# Patient Record
Sex: Female | Born: 2000
Health system: Southern US, Community
[De-identification: ages and names within clinical notes are randomized; demographics above are authoritative.]

---
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
## 2020-11-03 ENCOUNTER — Other Ambulatory Visit: Payer: Self-pay

## 2020-11-03 ENCOUNTER — Encounter: Payer: Self-pay | Admitting: Emergency Medicine

## 2020-11-03 ENCOUNTER — Ambulatory Visit
Admission: EM | Admit: 2020-11-03 | Discharge: 2020-11-03 | Disposition: A | Discharge status: Home / Self Care | Payer: Medicaid Other | Attending: Emergency Medicine | Admitting: Emergency Medicine

## 2020-11-03 ENCOUNTER — Ambulatory Visit: Admit: 2020-11-03 | Disposition: A | Discharge status: Home / Self Care | Payer: Self-pay

## 2020-11-03 DIAGNOSIS — R1013 Epigastric pain: Secondary | ICD-10-CM | POA: Diagnosis not present

## 2020-11-03 LAB — POCT URINALYSIS DIP (MANUAL ENTRY)
Blood, UA: NEGATIVE
Glucose, UA: NEGATIVE mg/dL
Ketones, POC UA: NEGATIVE mg/dL
Nitrite, UA: NEGATIVE
Spec Grav, UA: 1.025 (ref 1.010–1.025)
Urobilinogen, UA: >=8 E.U./dL — AB
pH, UA: 7 (ref 5.0–8.0)

## 2020-11-03 LAB — POCT URINE PREGNANCY: Preg Test, Ur: NEGATIVE

## 2020-11-03 MED ORDER — LIDOCAINE VISCOUS HCL 2 % MT SOLN
15.0000 mL | Freq: Once | OROMUCOSAL | Status: AC
  Administered 2020-11-03: 15 mL via ORAL

## 2020-11-03 MED ORDER — OMEPRAZOLE 20 MG PO CPDR: 20.0000 mg | DELAYED_RELEASE_CAPSULE | Freq: Two times a day (BID) | ORAL | 0 refills | Status: DC

## 2020-11-03 MED ORDER — ONDANSETRON 4 MG PO TBDP: 4.0000 mg | ORAL_TABLET | Freq: Three times a day (TID) | ORAL | 0 refills | Status: DC | PRN

## 2020-11-03 MED ORDER — ALUM & MAG HYDROXIDE-SIMETH 200-200-20 MG/5ML PO SUSP
30.0000 mL | Freq: Once | ORAL | Status: AC
  Administered 2020-11-03: 30 mL via ORAL

## 2020-11-03 MED ORDER — FAMOTIDINE 20 MG PO TABS: 20.0000 mg | ORAL_TABLET | Freq: Two times a day (BID) | ORAL | 0 refills | Status: DC

## 2020-11-03 NOTE — ED Provider Notes (Signed)
[facility]    CSN: [id number] Arrival date & time: 11/03/20  1139      History   Chief Complaint Chief Complaint  Patient presents with   Abdominal Pain    HPI Christy Quinn is a 20 y.o. female presenting today for evaluation of abdominal pain.  Reports pain in her upper abdomen described as a sharp sensation.  Associated nausea and vomiting which has since subsided.  Reports pain has eased off some, but overall has been persistent over the past 2 days.  Located in upper abdomen.  Denies any diarrhea or change in bowel movements.  Last bowel movement earlier today and was normal in size consistency, denies straining.  Denies blood in stool.  Denies any history of significant alcohol or tobacco use.  Denies history of GI problems.  Denies radiation into chest.  Denies any sore throat.  HPI  History reviewed. No pertinent past medical history.  There are no problems to display for this patient.   History reviewed. No pertinent surgical history.  OB History   No obstetric history on file.      Home Medications    Prior to Admission medications   Medication Sig Start Date End Date Taking? Authorizing Provider  famotidine (PEPCID) 20 MG tablet Take 1 tablet (20 mg total) by mouth 2 (two) times daily. 11/03/20  Yes Esly Selvage C, PA-C  omeprazole (PRILOSEC) 20 MG capsule Take 1 capsule (20 mg total) by mouth 2 (two) times daily before a meal for 15 days. 11/03/20 11/18/20 Yes Korynn Kenedy C, PA-C  ondansetron (ZOFRAN ODT) 4 MG disintegrating tablet Take 1 tablet (4 mg total) by mouth every 8 (eight) hours as needed for nausea or vomiting. 11/03/20  Yes Armondo Cech, Junius Creamer, PA-C    Family History History reviewed. No pertinent family history.  Social History Social History   Tobacco Use   Smoking status: Never   Smokeless tobacco: Never  Vaping Use   Vaping Use: Every day   Substances: Nicotine  Substance Use Topics   Alcohol use: Yes    Alcohol/week: 2.0  standard drinks    Types: 2 Shots of liquor per week    Comment: "tequilla, but not often"   Drug use: Yes    Frequency: 7.0 times per week    Types: Marijuana     Allergies   Patient has no known allergies.   Review of Systems Review of Systems  Constitutional:  Negative for fatigue and fever.  HENT:  Negative for mouth sores.   Eyes:  Negative for visual disturbance.  Respiratory:  Negative for shortness of breath.   Cardiovascular:  Negative for chest pain.  Gastrointestinal:  Positive for abdominal pain, nausea and vomiting. Negative for blood in stool, constipation and diarrhea.  Genitourinary:  Negative for genital sores.  Musculoskeletal:  Negative for arthralgias and joint swelling.  Skin:  Negative for color change, rash and wound.  Neurological:  Negative for dizziness, weakness, light-headedness and headaches.    Physical Exam Triage Vital Signs ED Triage Vitals  Enc Vitals Group     BP 11/03/20 1235 122/77     Pulse Rate 11/03/20 1235 60     Resp 11/03/20 1235 16     Temp 11/03/20 1235 97.9 F (36.6 C)     Temp Source 11/03/20 1235 Oral     SpO2 11/03/20 1235 98 %     Weight --      Height --      Head Circumference --  Peak Flow --      Pain Score 11/03/20 1232 10     Pain Loc --      Pain Edu? --      Excl. in GC? --    No data found.  Updated Vital Signs BP 122/77 (BP Location: Right Arm)   Pulse 60   Temp 97.9 F (36.6 C) (Oral)   Resp 16   LMP 10/17/2020 (Exact Date)   SpO2 98%   Visual Acuity Right Eye Distance:   Left Eye Distance:   Bilateral Distance:    Right Eye Near:   Left Eye Near:    Bilateral Near:     Physical Exam Vitals and nursing note reviewed.  Constitutional:      Appearance: She is well-developed.     Comments: No acute distress  HENT:     Head: Normocephalic and atraumatic.     Ears:     Comments:      Nose: Nose normal.     Mouth/Throat:     Comments: Oral mucosa pink and moist, no tonsillar  enlargement or exudate. Posterior pharynx patent and nonerythematous, no uvula deviation or swelling. Normal phonation.  Eyes:     Conjunctiva/sclera: Conjunctivae normal.  Cardiovascular:     Rate and Rhythm: Normal rate and regular rhythm.  Pulmonary:     Effort: Pulmonary effort is normal. No respiratory distress.     Comments: Breathing comfortably at rest, CTABL, no wheezing, rales or other adventitious sounds auscultated  Abdominal:     General: There is no distension.     Comments: Soft, nondistended, tenderness to palpation in epigastrium and left upper quadrant, negative rebound, negative Rovsing, negative McBurney's, negative Murphy's  Musculoskeletal:        General: Normal range of motion.     Cervical back: Neck supple.  Skin:    General: Skin is warm and dry.  Neurological:     Mental Status: She is alert and oriented to person, place, and time.     UC Treatments / Results  Labs (all labs ordered are listed, but only abnormal results are displayed) Labs Reviewed  POCT URINALYSIS DIP (MANUAL ENTRY) - Abnormal; Notable for the following components:      Result Value   Bilirubin, UA small (*)    Protein Ur, POC trace (*)    Urobilinogen, UA >=8.0 (*)    Leukocytes, UA Small (1+) (*)    All other components within normal limits  POCT URINE PREGNANCY    EKG   Radiology No results found.  Procedures Procedures (including critical care time)  Medications Ordered in UC Medications  alum & mag hydroxide-simeth (MAALOX/MYLANTA) 200-200-20 MG/5ML suspension 30 mL (30 mLs Oral Given 11/03/20 1303)    And  lidocaine (XYLOCAINE) 2 % viscous mouth solution 15 mL (15 mLs Oral Given 11/03/20 1303)    Initial Impression / Assessment and Plan / UC Course  I have reviewed the triage vital signs and the nursing notes.  Pertinent labs & imaging results that were available during my care of the patient were reviewed by me and considered in my medical decision making (see  chart for details).     Upper abdominal discomfort in epigastrium/left upper quadrant, suspect most likely underlying gastritis or GERD contributing to symptoms.  GI cocktail provided with some improvement of symptoms.  We will proceed with treatment with 2-week trial of PPI along with Pepcid for more symptomatic relief, avoid trigger foods.  Zofran as needed for nausea.  Discussed strict return precautions. Patient verbalized understanding and is agreeable with plan.  Final Clinical Impressions(s) / UC Diagnoses   Final diagnoses:  Epigastric pain     Discharge Instructions      Begin omeprazole twice daily with meals x2 weeks for prevention of underlying acid Supplement with Pepcid twice daily as needed for underlying abdominal discomfort Zofran dissolved in mouth as needed for nausea Avoid trigger foods-see attached Follow-up if not improving or worsening     ED Prescriptions     Medication Sig Dispense Auth. Provider   ondansetron (ZOFRAN ODT) 4 MG disintegrating tablet Take 1 tablet (4 mg total) by mouth every 8 (eight) hours as needed for nausea or vomiting. 20 tablet Shauna Bodkins C, PA-C   famotidine (PEPCID) 20 MG tablet Take 1 tablet (20 mg total) by mouth 2 (two) times daily. 30 tablet Dorrance Sellick C, PA-C   omeprazole (PRILOSEC) 20 MG capsule Take 1 capsule (20 mg total) by mouth 2 (two) times daily before a meal for 15 days. 30 capsule Shakesha Soltau, [place] C, PA-C      PDMP not reviewed this encounter.   Lew Dawes, [place] 11/03/20 1358

## 2020-11-03 NOTE — Discharge Instructions (Addendum)
Begin omeprazole twice daily with meals x2 weeks for prevention of underlying acid Supplement with Pepcid twice daily as needed for underlying abdominal discomfort Zofran dissolved in mouth as needed for nausea Avoid trigger foods-see attached Follow-up if not improving or worsening

## 2020-11-03 NOTE — ED Triage Notes (Signed)
Patient c/o ABD pain x 2 days.   Patient denies fever. Patient denies dysuria, diarrhea or constipation.   Patient states " I've been having this pain all year but it's been constant since Saturday night".   Patient endorses nausea.   Patient hasn't taken any medications for symptoms.

## 2020-12-23 ENCOUNTER — Ambulatory Visit (HOSPITAL_BASED_OUTPATIENT_CLINIC_OR_DEPARTMENT_OTHER): Payer: Medicaid Other | Admitting: Family Medicine

## 2020-12-30 ENCOUNTER — Ambulatory Visit
Admission: EM | Admit: 2020-12-30 | Discharge: 2020-12-30 | Disposition: A | Discharge status: Home / Self Care | Payer: Medicaid Other | Attending: Physician Assistant | Admitting: Physician Assistant

## 2020-12-30 ENCOUNTER — Other Ambulatory Visit: Payer: Self-pay

## 2020-12-30 DIAGNOSIS — R112 Nausea with vomiting, unspecified: Secondary | ICD-10-CM

## 2020-12-30 DIAGNOSIS — R1013 Epigastric pain: Secondary | ICD-10-CM

## 2020-12-30 MED ORDER — ONDANSETRON 4 MG PO TBDP: 4.0000 mg | ORAL_TABLET | Freq: Three times a day (TID) | ORAL | 0 refills | Status: DC | PRN

## 2020-12-30 NOTE — ED Triage Notes (Signed)
Pt c/o abdominal pain, nausea, dizziness, and emesis. States her stomach has been hurting since the beginning of the year and it happens when she eats a lot of food or certain foods. States she has been to UC before for this and was treated with a nausea medication that did provide symptom relief.

## 2020-12-30 NOTE — Discharge Instructions (Addendum)
Use ondansetron as needed for nausea. I recommend you establish care with primary care. We will call with any abnormal testing results-- otherwise check MyChart for all results.

## 2020-12-30 NOTE — ED Provider Notes (Signed)
[facility]    CSN: [id number] Arrival date & time: 12/30/20  1737      History   Chief Complaint Chief Complaint  Patient presents with   Emesis    HPI Christy Quinn is a 20 y.o. female.   Patient here today for nausea, vomiting and epigastric pain that started recently.  She states that she has had intermittent symptoms of the same, and in the past has been given a nausea medicine which has been helpful.  She denies any fever or chills.  She has not had any diarrhea.  She states in the past she has been told she has GERD but she is not sure if this was an accurate diagnosis. She denies any blood in her stool or dark tarry stools.   The history is provided by the patient.  Emesis Associated symptoms: abdominal pain   Associated symptoms: no chills, no diarrhea and no fever    History reviewed. No pertinent past medical history.  There are no problems to display for this patient.   History reviewed. No pertinent surgical history.  OB History   No obstetric history on file.      Home Medications    Prior to Admission medications   Medication Sig Start Date End Date Taking? Authorizing Provider  ondansetron (ZOFRAN-ODT) 4 MG disintegrating tablet Take 1 tablet (4 mg total) by mouth every 8 (eight) hours as needed for nausea or vomiting. 12/30/20  Yes Tomi Bamberger, PA-C  famotidine (PEPCID) 20 MG tablet Take 1 tablet (20 mg total) by mouth 2 (two) times daily. 11/03/20   Wieters, Hallie C, PA-C  omeprazole (PRILOSEC) 20 MG capsule Take 1 capsule (20 mg total) by mouth 2 (two) times daily before a meal for 15 days. 11/03/20 11/18/20  Wieters, Junius Creamer, PA-C    Family History History reviewed. No pertinent family history.  Social History Social History   Tobacco Use   Smoking status: Never   Smokeless tobacco: Never  Vaping Use   Vaping Use: Every day   Substances: Nicotine  Substance Use Topics   Alcohol use: Yes    Alcohol/week: 2.0 standard  drinks    Types: 2 Shots of liquor per week    Comment: "tequilla, but not often"   Drug use: Yes    Frequency: 7.0 times per week    Types: Marijuana     Allergies   Patient has no known allergies.   Review of Systems Review of Systems  Constitutional:  Negative for chills and fever.  Eyes:  Negative for discharge and redness.  Respiratory:  Negative for shortness of breath.   Gastrointestinal:  Positive for abdominal pain, nausea and vomiting. Negative for blood in stool and diarrhea.    Physical Exam Triage Vital Signs ED Triage Vitals  Enc Vitals Group     BP 12/30/20 1802 138/68     Pulse Rate 12/30/20 1802 60     Resp 12/30/20 1802 18     Temp 12/30/20 1802 98.1 F (36.7 C)     Temp Source 12/30/20 1802 Oral     SpO2 12/30/20 1802 100 %     Weight --      Height --      Head Circumference --      Peak Flow --      Pain Score 12/30/20 1803 5     Pain Loc --      Pain Edu? --      Excl. in GC? --  No data found.  Updated Vital Signs BP 138/68 (BP Location: Left Arm)   Pulse 60   Temp 98.1 F (36.7 C) (Oral)   Resp 18   SpO2 100%   Physical Exam Vitals and nursing note reviewed.  Constitutional:      General: She is not in acute distress.    Appearance: Normal appearance. She is not ill-appearing.  HENT:     Head: Normocephalic and atraumatic.  Eyes:     Conjunctiva/sclera: Conjunctivae normal.  Cardiovascular:     Rate and Rhythm: Normal rate and regular rhythm.     Heart sounds: Normal heart sounds. No murmur heard. Pulmonary:     Effort: Pulmonary effort is normal. No respiratory distress.     Breath sounds: Normal breath sounds. No wheezing, rhonchi or rales.  Abdominal:     General: Abdomen is flat. Bowel sounds are normal. There is no distension.     Palpations: Abdomen is soft.     Tenderness: There is no abdominal tenderness. There is no guarding.  Neurological:     Mental Status: She is alert.     UC Treatments / Results   Labs (all labs ordered are listed, but only abnormal results are displayed) Labs Reviewed  CBC WITH DIFFERENTIAL/PLATELET  COMPREHENSIVE METABOLIC PANEL  AMYLASE  LIPASE    EKG   Radiology No results found.  Procedures Procedures (including critical care time)  Medications Ordered in UC Medications - No data to display  Initial Impression / Assessment and Plan / UC Course  I have reviewed the triage vital signs and the nursing notes.  Pertinent labs & imaging results that were available during my care of the patient were reviewed by me and considered in my medical decision making (see chart for details).  Will order labs for further evaluation of epigastric pain.  Discussed concern for possible gallbladder etiology, and recommended she establish care with PCP as well.  Will await results of labs for further recommendation.  Ondansetron prescribed for nausea in the meantime.  Final Clinical Impressions(s) / UC Diagnoses   Final diagnoses:  Abdominal pain, epigastric  Nausea and vomiting, unspecified vomiting type     Discharge Instructions      Use ondansetron as needed for nausea. I recommend you establish care with primary care. We will call with any abnormal testing results-- otherwise check MyChart for all results.      ED Prescriptions     Medication Sig Dispense Auth. Provider   ondansetron (ZOFRAN-ODT) 4 MG disintegrating tablet Take 1 tablet (4 mg total) by mouth every 8 (eight) hours as needed for nausea or vomiting. 20 tablet Tomi Bamberger, PA-C      PDMP not reviewed this encounter.   Tomi Bamberger, PA-C 12/30/20 [phone]

## 2020-12-31 LAB — CBC WITH DIFFERENTIAL/PLATELET
Basophils Absolute: 0 10*3/uL (ref 0.0–0.2)
Basos: 1 %
EOS (ABSOLUTE): 0.1 10*3/uL (ref 0.0–0.4)
Eos: 2 %
Hematocrit: 39.6 % (ref 34.0–46.6)
Hemoglobin: 12.9 g/dL (ref 11.1–15.9)
Immature Grans (Abs): 0 10*3/uL (ref 0.0–0.1)
Immature Granulocytes: 0 %
Lymphocytes Absolute: 1.3 10*3/uL (ref 0.7–3.1)
Lymphs: 27 %
MCH: 27.7 pg (ref 26.6–33.0)
MCHC: 32.6 g/dL (ref 31.5–35.7)
MCV: 85 fL (ref 79–97)
Monocytes Absolute: 0.5 10*3/uL (ref 0.1–0.9)
Monocytes: 10 %
Neutrophils Absolute: 2.9 10*3/uL (ref 1.4–7.0)
Neutrophils: 60 %
Platelets: 265 10*3/uL (ref 150–450)
RBC: 4.66 x10E6/uL (ref 3.77–5.28)
RDW: 13.2 % (ref 11.7–15.4)
WBC: 4.9 10*3/uL (ref 3.4–10.8)

## 2020-12-31 LAB — COMPREHENSIVE METABOLIC PANEL
ALT: 213 IU/L — ABNORMAL HIGH (ref 0–32)
AST: 566 IU/L (ref 0–40)
Albumin/Globulin Ratio: 2.1 (ref 1.2–2.2)
Albumin: 4.9 g/dL (ref 3.9–5.0)
Alkaline Phosphatase: 139 IU/L — ABNORMAL HIGH (ref 44–121)
BUN/Creatinine Ratio: 16 (ref 9–23)
BUN: 13 mg/dL (ref 6–20)
Bilirubin Total: 0.8 mg/dL (ref 0.0–1.2)
CO2: 24 mmol/L (ref 20–29)
Calcium: 9.8 mg/dL (ref 8.7–10.2)
Chloride: 102 mmol/L (ref 96–106)
Creatinine, Ser: 0.81 mg/dL (ref 0.57–1.00)
Globulin, Total: 2.3 g/dL (ref 1.5–4.5)
Glucose: 85 mg/dL (ref 70–99)
Potassium: 3.8 mmol/L (ref 3.5–5.2)
Sodium: 140 mmol/L (ref 134–144)
Total Protein: 7.2 g/dL (ref 6.0–8.5)
eGFR: 106 mL/min/{1.73_m2} (ref >59)

## 2020-12-31 LAB — LIPASE: Lipase: 35 U/L (ref 14–72)

## 2020-12-31 LAB — AMYLASE: Amylase: 42 U/L (ref 31–110)

## 2021-01-01 ENCOUNTER — Encounter (HOSPITAL_COMMUNITY)
Admission: EM | Disposition: A | Discharge status: Home / Self Care | Payer: Self-pay | Source: Home / Self Care | Attending: Emergency Medicine

## 2021-01-01 ENCOUNTER — Ambulatory Visit (HOSPITAL_COMMUNITY)
Admission: EM | Admit: 2021-01-01 | Discharge: 2021-01-01 | Disposition: A | Discharge status: Home / Self Care | Payer: Medicaid Other | Attending: Emergency Medicine | Admitting: Emergency Medicine

## 2021-01-01 ENCOUNTER — Emergency Department (HOSPITAL_COMMUNITY): Payer: Medicaid Other

## 2021-01-01 ENCOUNTER — Other Ambulatory Visit: Payer: Self-pay

## 2021-01-01 ENCOUNTER — Emergency Department (HOSPITAL_COMMUNITY): Payer: Medicaid Other | Admitting: Anesthesiology

## 2021-01-01 ENCOUNTER — Encounter (HOSPITAL_COMMUNITY): Payer: Self-pay | Admitting: Pharmacy Technician

## 2021-01-01 DIAGNOSIS — R109 Unspecified abdominal pain: Secondary | ICD-10-CM | POA: Diagnosis present

## 2021-01-01 DIAGNOSIS — F1290 Cannabis use, unspecified, uncomplicated: Secondary | ICD-10-CM | POA: Insufficient documentation

## 2021-01-01 DIAGNOSIS — Z20822 Contact with and (suspected) exposure to covid-19: Secondary | ICD-10-CM | POA: Insufficient documentation

## 2021-01-01 DIAGNOSIS — K8010 Calculus of gallbladder with chronic cholecystitis without obstruction: Secondary | ICD-10-CM | POA: Diagnosis not present

## 2021-01-01 DIAGNOSIS — E876 Hypokalemia: Secondary | ICD-10-CM | POA: Insufficient documentation

## 2021-01-01 DIAGNOSIS — Z419 Encounter for procedure for purposes other than remedying health state, unspecified: Secondary | ICD-10-CM

## 2021-01-01 HISTORY — PX: CHOLECYSTECTOMY: SHX55

## 2021-01-01 LAB — URINALYSIS, ROUTINE W REFLEX MICROSCOPIC
Bilirubin Urine: NEGATIVE
Glucose, UA: NEGATIVE mg/dL
Hgb urine dipstick: NEGATIVE
Ketones, ur: 5 mg/dL — AB
Nitrite: NEGATIVE
Protein, ur: NEGATIVE mg/dL
Specific Gravity, Urine: 1.026 (ref 1.005–1.030)
pH: 5 (ref 5.0–8.0)

## 2021-01-01 LAB — CBC
HCT: 41.7 % (ref 36.0–46.0)
Hemoglobin: 13.5 g/dL (ref 12.0–15.0)
MCH: 27.9 pg (ref 26.0–34.0)
MCHC: 32.4 g/dL (ref 30.0–36.0)
MCV: 86.2 fL (ref 80.0–100.0)
Platelets: 261 10*3/uL (ref 150–400)
RBC: 4.84 MIL/uL (ref 3.87–5.11)
RDW: 13 % (ref 11.5–15.5)
WBC: 4.6 10*3/uL (ref 4.0–10.5)
nRBC: 0 % (ref 0.0–0.2)

## 2021-01-01 LAB — COMPREHENSIVE METABOLIC PANEL
ALT: 367 U/L — ABNORMAL HIGH (ref 0–44)
AST: 164 U/L — ABNORMAL HIGH (ref 15–41)
Albumin: 4.2 g/dL (ref 3.5–5.0)
Alkaline Phosphatase: 126 U/L (ref 38–126)
Anion gap: 11 (ref 5–15)
BUN: 11 mg/dL (ref 6–20)
CO2: 21 mmol/L — ABNORMAL LOW (ref 22–32)
Calcium: 9.6 mg/dL (ref 8.9–10.3)
Chloride: 106 mmol/L (ref 98–111)
Creatinine, Ser: 0.8 mg/dL (ref 0.44–1.00)
GFR, Estimated: >60 mL/min (ref >60)
Glucose, Bld: 84 mg/dL (ref 70–99)
Potassium: 3.4 mmol/L — ABNORMAL LOW (ref 3.5–5.1)
Sodium: 138 mmol/L (ref 135–145)
Total Bilirubin: 1.3 mg/dL — ABNORMAL HIGH (ref 0.3–1.2)
Total Protein: 7.3 g/dL (ref 6.5–8.1)

## 2021-01-01 LAB — LIPASE, BLOOD: Lipase: 30 U/L (ref 11–51)

## 2021-01-01 LAB — I-STAT BETA HCG BLOOD, ED (MC, WL, AP ONLY): I-stat hCG, quantitative: <5 m[IU]/mL (ref <5)

## 2021-01-01 LAB — RESP PANEL BY RT-PCR (FLU A&B, COVID) ARPGX2
Influenza A by PCR: NEGATIVE
Influenza B by PCR: NEGATIVE
SARS Coronavirus 2 by RT PCR: NEGATIVE

## 2021-01-01 SURGERY — LAPAROSCOPIC CHOLECYSTECTOMY WITH INTRAOPERATIVE CHOLANGIOGRAM
Anesthesia: General

## 2021-01-01 MED ORDER — ORAL CARE MOUTH RINSE: 15.0000 mL | Freq: Once | OROMUCOSAL | Status: AC

## 2021-01-01 MED ORDER — ONDANSETRON HCL 4 MG/2ML IJ SOLN
INTRAMUSCULAR | Status: AC
  Filled 2021-01-01: qty 2

## 2021-01-01 MED ORDER — DEXMEDETOMIDINE (PRECEDEX) IN NS 20 MCG/5ML (4 MCG/ML) IV SYRINGE
PREFILLED_SYRINGE | INTRAVENOUS | Status: DC | PRN
  Administered 2021-01-01 (×2): 4 ug via INTRAVENOUS
  Administered 2021-01-01: 8 ug via INTRAVENOUS
  Administered 2021-01-01: 4 ug via INTRAVENOUS

## 2021-01-01 MED ORDER — KETOROLAC TROMETHAMINE 30 MG/ML IJ SOLN
INTRAMUSCULAR | Status: DC | PRN
  Administered 2021-01-01: 30 mg via INTRAVENOUS

## 2021-01-01 MED ORDER — BUPIVACAINE-EPINEPHRINE (PF) 0.5% -1:200000 IJ SOLN
INTRAMUSCULAR | Status: DC | PRN
  Administered 2021-01-01: 15 mL

## 2021-01-01 MED ORDER — SODIUM CHLORIDE 0.9 % IR SOLN
Status: DC | PRN
  Administered 2021-01-01: 1000 mL

## 2021-01-01 MED ORDER — PROPOFOL 10 MG/ML IV BOLUS
INTRAVENOUS | Status: DC | PRN
  Administered 2021-01-01: 180 mg via INTRAVENOUS

## 2021-01-01 MED ORDER — OXYCODONE HCL 5 MG/5ML PO SOLN: 5.0000 mg | Freq: Once | ORAL | Status: AC | PRN

## 2021-01-01 MED ORDER — ACETAMINOPHEN 160 MG/5ML PO SOLN: 1000.0000 mg | Freq: Once | ORAL | Status: DC | PRN

## 2021-01-01 MED ORDER — PROPOFOL 10 MG/ML IV BOLUS
INTRAVENOUS | Status: AC
  Filled 2021-01-01: qty 20

## 2021-01-01 MED ORDER — FENTANYL CITRATE (PF) 100 MCG/2ML IJ SOLN: 25.0000 ug | INTRAMUSCULAR | Status: DC | PRN

## 2021-01-01 MED ORDER — ACETAMINOPHEN 10 MG/ML IV SOLN
INTRAVENOUS | Status: AC
  Filled 2021-01-01: qty 100

## 2021-01-01 MED ORDER — MIDAZOLAM HCL 2 MG/2ML IJ SOLN
INTRAMUSCULAR | Status: AC
  Filled 2021-01-01: qty 2

## 2021-01-01 MED ORDER — ACETAMINOPHEN 10 MG/ML IV SOLN: 1000.0000 mg | Freq: Once | INTRAVENOUS | Status: DC | PRN

## 2021-01-01 MED ORDER — SUGAMMADEX SODIUM 200 MG/2ML IV SOLN
INTRAVENOUS | Status: DC | PRN
  Administered 2021-01-01: 200 mg via INTRAVENOUS

## 2021-01-01 MED ORDER — MIDAZOLAM HCL 2 MG/2ML IJ SOLN
INTRAMUSCULAR | Status: DC | PRN
  Administered 2021-01-01: 2 mg via INTRAVENOUS

## 2021-01-01 MED ORDER — HYDROCODONE-ACETAMINOPHEN 5-325 MG PO TABS: 1.0000 | ORAL_TABLET | Freq: Four times a day (QID) | ORAL | 0 refills | Status: AC | PRN

## 2021-01-01 MED ORDER — ONDANSETRON HCL 4 MG/2ML IJ SOLN
INTRAMUSCULAR | Status: DC | PRN
  Administered 2021-01-01: 4 mg via INTRAVENOUS

## 2021-01-01 MED ORDER — ACETAMINOPHEN 500 MG PO TABS: 1000.0000 mg | ORAL_TABLET | Freq: Once | ORAL | Status: DC | PRN

## 2021-01-01 MED ORDER — ROCURONIUM BROMIDE 10 MG/ML (PF) SYRINGE
PREFILLED_SYRINGE | INTRAVENOUS | Status: AC
  Filled 2021-01-01: qty 10

## 2021-01-01 MED ORDER — KETOROLAC TROMETHAMINE 30 MG/ML IJ SOLN
INTRAMUSCULAR | Status: AC
  Filled 2021-01-01: qty 1

## 2021-01-01 MED ORDER — FENTANYL CITRATE (PF) 100 MCG/2ML IJ SOLN
INTRAMUSCULAR | Status: AC
  Filled 2021-01-01: qty 2

## 2021-01-01 MED ORDER — DEXAMETHASONE SODIUM PHOSPHATE 10 MG/ML IJ SOLN
INTRAMUSCULAR | Status: AC
  Filled 2021-01-01: qty 1

## 2021-01-01 MED ORDER — CEFAZOLIN SODIUM-DEXTROSE 2-4 GM/100ML-% IV SOLN
INTRAVENOUS | Status: AC
  Filled 2021-01-01: qty 100

## 2021-01-01 MED ORDER — CHLORHEXIDINE GLUCONATE 0.12 % MT SOLN
OROMUCOSAL | Status: AC
  Administered 2021-01-01: 15 mL via OROMUCOSAL
  Filled 2021-01-01: qty 15

## 2021-01-01 MED ORDER — DEXAMETHASONE SODIUM PHOSPHATE 10 MG/ML IJ SOLN
INTRAMUSCULAR | Status: DC | PRN
  Administered 2021-01-01: 10 mg via INTRAVENOUS

## 2021-01-01 MED ORDER — FENTANYL CITRATE (PF) 250 MCG/5ML IJ SOLN
INTRAMUSCULAR | Status: DC | PRN
  Administered 2021-01-01: 50 ug via INTRAVENOUS
  Administered 2021-01-01: 100 ug via INTRAVENOUS
  Administered 2021-01-01: 150 ug via INTRAVENOUS

## 2021-01-01 MED ORDER — SODIUM CHLORIDE 0.9 % IV SOLN: 2.0000 g | Freq: Once | INTRAVENOUS | Status: DC

## 2021-01-01 MED ORDER — OXYCODONE HCL 5 MG PO TABS
ORAL_TABLET | ORAL | Status: AC
  Filled 2021-01-01: qty 1

## 2021-01-01 MED ORDER — LACTATED RINGERS IV SOLN: INTRAVENOUS | Status: DC

## 2021-01-01 MED ORDER — CHLORHEXIDINE GLUCONATE 0.12 % MT SOLN: 15.0000 mL | Freq: Once | OROMUCOSAL | Status: AC

## 2021-01-01 MED ORDER — ROCURONIUM BROMIDE 10 MG/ML (PF) SYRINGE
PREFILLED_SYRINGE | INTRAVENOUS | Status: DC | PRN
  Administered 2021-01-01: 70 mg via INTRAVENOUS

## 2021-01-01 MED ORDER — FENTANYL CITRATE (PF) 250 MCG/5ML IJ SOLN
INTRAMUSCULAR | Status: AC
  Filled 2021-01-01: qty 5

## 2021-01-01 MED ORDER — CEFAZOLIN SODIUM-DEXTROSE 1-4 GM/50ML-% IV SOLN
INTRAVENOUS | Status: DC | PRN
  Administered 2021-01-01: 2 g via INTRAVENOUS

## 2021-01-01 MED ORDER — SODIUM CHLORIDE 0.9 % IV SOLN: INTRAVENOUS | Status: DC

## 2021-01-01 MED ORDER — SODIUM CHLORIDE 0.9 % IV SOLN
INTRAVENOUS | Status: DC | PRN
  Administered 2021-01-01: 7 mL

## 2021-01-01 MED ORDER — OXYCODONE HCL 5 MG PO TABS
5.0000 mg | ORAL_TABLET | Freq: Once | ORAL | Status: AC | PRN
  Administered 2021-01-01: 5 mg via ORAL

## 2021-01-01 MED ORDER — LIDOCAINE 2% (20 MG/ML) 5 ML SYRINGE
INTRAMUSCULAR | Status: DC | PRN
  Administered 2021-01-01: 60 mg via INTRAVENOUS

## 2021-01-01 SURGICAL SUPPLY — 35 items
APPLIER CLIP ROT 10 11.4 M/L (STAPLE) ×2
BENZOIN TINCTURE PRP APPL 2/3 (GAUZE/BANDAGES/DRESSINGS) ×2 IMPLANT
CANISTER SUCT 3000ML PPV (MISCELLANEOUS) ×2 IMPLANT
CHLORAPREP W/TINT 26 (MISCELLANEOUS) ×2 IMPLANT
CLIP APPLIE ROT 10 11.4 M/L (STAPLE) ×1 IMPLANT
COVER MAYO STAND STRL (DRAPES) ×2 IMPLANT
COVER SURGICAL LIGHT HANDLE (MISCELLANEOUS) ×2 IMPLANT
DRAPE C-ARM 42X120 X-RAY (DRAPES) ×2 IMPLANT
DRSG TEGADERM 2-3/8X2-3/4 SM (GAUZE/BANDAGES/DRESSINGS) ×6 IMPLANT
DRSG TEGADERM 4X4.75 (GAUZE/BANDAGES/DRESSINGS) ×2 IMPLANT
ELECT REM PT RETURN 9FT ADLT (ELECTROSURGICAL) ×2
ELECTRODE REM PT RTRN 9FT ADLT (ELECTROSURGICAL) ×1 IMPLANT
GAUZE SPONGE 2X2 8PLY STRL LF (GAUZE/BANDAGES/DRESSINGS) ×1 IMPLANT
GLOVE SURG ENC MOIS LTX SZ7 (GLOVE) ×2 IMPLANT
GLOVE SURG UNDER POLY LF SZ7.5 (GLOVE) ×2 IMPLANT
GOWN STRL REUS W/ TWL LRG LVL3 (GOWN DISPOSABLE) ×4 IMPLANT
GOWN STRL REUS W/TWL LRG LVL3 (GOWN DISPOSABLE) ×4
KIT BASIN OR (CUSTOM PROCEDURE TRAY) ×2 IMPLANT
KIT TURNOVER KIT B (KITS) ×2 IMPLANT
NS IRRIG 1000ML POUR BTL (IV SOLUTION) ×2 IMPLANT
PAD ARMBOARD 7.5X6 YLW CONV (MISCELLANEOUS) ×2 IMPLANT
POUCH ENDO CATCH II 15MM (MISCELLANEOUS) ×2 IMPLANT
SCISSORS LAP 5X35 DISP (ENDOMECHANICALS) ×2 IMPLANT
SET CHOLANGIOGRAPH 5 50 .035 (SET/KITS/TRAYS/PACK) ×2 IMPLANT
SET IRRIG TUBING LAPAROSCOPIC (IRRIGATION / IRRIGATOR) ×2 IMPLANT
SET TUBE SMOKE EVAC HIGH FLOW (TUBING) ×2 IMPLANT
SLEEVE ENDOPATH XCEL 5M (ENDOMECHANICALS) ×2 IMPLANT
SPONGE GAUZE 2X2 STER 10/PKG (GAUZE/BANDAGES/DRESSINGS) ×1
STRIP CLOSURE SKIN 1/2X4 (GAUZE/BANDAGES/DRESSINGS) ×2 IMPLANT
SUT MNCRL AB 4-0 PS2 18 (SUTURE) ×2 IMPLANT
TOWEL GREEN STERILE FF (TOWEL DISPOSABLE) ×2 IMPLANT
TRAY LAPAROSCOPIC MC (CUSTOM PROCEDURE TRAY) ×2 IMPLANT
TROCAR XCEL BLUNT TIP 100MML (ENDOMECHANICALS) ×2 IMPLANT
TROCAR XCEL NON-BLD 11X100MML (ENDOMECHANICALS) ×2 IMPLANT
TROCAR XCEL NON-BLD 5MMX100MML (ENDOMECHANICALS) ×2 IMPLANT

## 2021-01-01 NOTE — Discharge Instructions (Addendum)
[facility] LAPAROSCOPIC SURGERY: POST OP INSTRUCTIONS Always review your discharge instruction sheet given to you by the facility where your surgery was performed. IF YOU HAVE DISABILITY OR FAMILY LEAVE FORMS, YOU MUST BRING THEM TO THE OFFICE FOR PROCESSING.   DO NOT GIVE THEM TO YOUR DOCTOR.  PAIN CONTROL  First take acetaminophen (Tylenol) AND/or ibuprofen (Advil) to control your pain after surgery.  Follow directions on package.  Taking acetaminophen (Tylenol) and/or ibuprofen (Advil) regularly after surgery will help to control your pain and lower the amount of prescription pain medication you may need.  You should not take more than 3,000 mg (3 grams) of acetaminophen (Tylenol) in 24 hours.  You should not take ibuprofen (Advil), aleve, motrin, naprosyn or other NSAIDS if you have a history of stomach ulcers or chronic kidney disease.  A prescription for pain medication may be given to you upon discharge.  Take your pain medication as prescribed, if you still have uncontrolled pain after taking acetaminophen (Tylenol) or ibuprofen (Advil). Use ice packs to help control pain. If you need a refill on your pain medication, please contact your pharmacy.  They will contact our office to request authorization. Prescriptions will not be filled after 5pm or on week-ends.  HOME MEDICATIONS Take your usually prescribed medications unless otherwise directed.  DIET You should follow a light diet the first few days after arrival home.  Be sure to include lots of fluids daily. Avoid fatty, fried foods.   CONSTIPATION It is common to experience some constipation after surgery and if you are taking pain medication.  Increasing fluid intake and taking a stool softener (such as Colace) will usually help or prevent this problem from occurring.  A mild laxative (Milk of Magnesia or Miralax) should be taken according to package instructions if there are no bowel movements after 48  hours.  WOUND/INCISION CARE Most patients will experience some swelling and bruising in the area of the incisions.  Ice packs will help.  Swelling and bruising can take several days to resolve.  Unless discharge instructions indicate otherwise, follow guidelines below  STERI-STRIPS - you may remove your outer bandages 48 hours after surgery, and you may shower at that time.  You have steri-strips (small skin tapes) in place directly over the incision.  These strips should be left on the skin for 7-10 days.   DERMABOND/SKIN GLUE - you may shower in 24 hours.  The glue will flake off over the next 2-3 weeks. Any sutures or staples will be removed at the office during your follow-up visit.  ACTIVITIES You may resume regular (light) daily activities beginning the next day--such as daily self-care, walking, climbing stairs--gradually increasing activities as tolerated.  You may have sexual intercourse when it is comfortable.  Refrain from any heavy lifting or straining until approved by your doctor. You may drive when you are no longer taking prescription pain medication, you can comfortably wear a seatbelt, and you can safely maneuver your car and apply brakes.  FOLLOW-UP You should see your doctor in the office for a follow-up appointment approximately 2-3 weeks after your surgery.  You should have been given your post-op/follow-up appointment when your surgery was scheduled.  If you did not receive a post-op/follow-up appointment, make sure that you call for this appointment within a day or two after you arrive home to insure a convenient appointment time.   WHEN TO CALL YOUR DOCTOR: Fever over 101.0 Inability to urinate Continued bleeding from incision.   Increased pain, redness, or drainage from the incision. Increasing abdominal pain  The clinic staff is available to answer your questions during regular business hours.  Please don't hesitate to call and ask to speak to one of the nurses for  clinical concerns.  If you have a medical emergency, go to the nearest emergency room or call 911.  A surgeon from [facility] is always on call at the hospital. [address], [address], [place], [place]  [address] ? [address] [address], [place], [place]   [address] [phone] ? [phone] ? FAX [phone] Web site: [email or web address]      Managing Your Pain After Surgery Without Opioids    Thank you for participating in our program to help patients manage their pain after surgery without opioids. This is part of our effort to provide you with the best care possible, without exposing you or your family to the risk that opioids pose.  What pain can I expect after surgery? You can expect to have some pain after surgery. This is normal. The pain is typically worse the day after surgery, and quickly begins to get better. Many studies have found that many patients are able to manage their pain after surgery with Over-the-Counter (OTC) medications such as Tylenol and Motrin. If you have a condition that does not allow you to take Tylenol or Motrin, notify your surgical team.  How will I manage my pain? The best strategy for controlling your pain after surgery is around the clock pain control with Tylenol (acetaminophen) and Motrin (ibuprofen or Advil). Alternating these medications with each other allows you to maximize your pain control. In addition to Tylenol and Motrin, you can use heating pads or ice packs on your incisions to help reduce your pain.  How will I alternate your regular strength over-the-counter pain medication? You will take a dose of pain medication every three hours. Start by taking 650 mg of Tylenol (2 pills of 325 mg) 3 hours later take 600 mg of Motrin (3 pills of 200 mg) 3 hours after taking the Motrin take 650 mg of Tylenol 3 hours after that take 600 mg of Motrin.   - 1 -  See example - if your first dose of Tylenol is at 12:00  PM   12:00 PM Tylenol 650 mg (2 pills of 325 mg)  3:00 PM Motrin 600 mg (3 pills of 200 mg)  6:00 PM Tylenol 650 mg (2 pills of 325 mg)  9:00 PM Motrin 600 mg (3 pills of 200 mg)  Continue alternating every 3 hours   We recommend that you follow this schedule around-the-clock for at least 3 days after surgery, or until you feel that it is no longer needed. Use the table on the last page of this handout to keep track of the medications you are taking. Important: Do not take more than 3000mg of Tylenol or 3200mg of Motrin in a 24-hour period. Do not take ibuprofen/Motrin if you have a history of bleeding stomach ulcers, severe kidney disease, &/or actively taking a blood thinner  What if I still have pain? If you have pain that is not controlled with the over-the-counter pain medications (Tylenol and Motrin or Advil) you might have what we call "breakthrough" pain. You will receive a prescription for a small amount of an opioid pain medication such as Oxycodone, Tramadol, or Tylenol with Codeine. Use these opioid pills in the first 24 hours after surgery if you have breakthrough pain. Do   not take more than 1 pill every 4-6 hours.  If you still have uncontrolled pain after using all opioid pills, don't hesitate to call our staff using the number provided. We will help make sure you are managing your pain in the best way possible, and if necessary, we can provide a prescription for additional pain medication.   Day 1    Time  Name of Medication Number of pills taken  Amount of Acetaminophen  Pain Level   Comments  AM PM       AM PM       AM PM       AM PM       AM PM       AM PM       AM PM       AM PM       Total Daily amount of Acetaminophen Do not take more than  3,000 mg per day      Day 2    Time  Name of Medication Number of pills taken  Amount of Acetaminophen  Pain Level   Comments  AM PM       AM PM       AM PM       AM PM       AM PM       AM PM       AM  PM       AM PM       Total Daily amount of Acetaminophen Do not take more than  3,000 mg per day      Day 3    Time  Name of Medication Number of pills taken  Amount of Acetaminophen  Pain Level   Comments  AM PM       AM PM       AM PM       AM PM          AM PM       AM PM       AM PM       AM PM       Total Daily amount of Acetaminophen Do not take more than  3,000 mg per day      Day 4    Time  Name of Medication Number of pills taken  Amount of Acetaminophen  Pain Level   Comments  AM PM       AM PM       AM PM       AM PM       AM PM       AM PM       AM PM       AM PM       Total Daily amount of Acetaminophen Do not take more than  3,000 mg per day      Day 5    Time  Name of Medication Number of pills taken  Amount of Acetaminophen  Pain Level   Comments  AM PM       AM PM       AM PM       AM PM       AM PM       AM PM       AM PM       AM PM       Total Daily amount of Acetaminophen Do not take more than    3,000 mg per day       Day 6    Time  Name of Medication Number of pills taken  Amount of Acetaminophen  Pain Level  Comments  AM PM       AM PM       AM PM       AM PM       AM PM       AM PM       AM PM       AM PM       Total Daily amount of Acetaminophen Do not take more than  3,000 mg per day      Day 7    Time  Name of Medication Number of pills taken  Amount of Acetaminophen  Pain Level   Comments  AM PM       AM PM       AM PM       AM PM       AM PM       AM PM       AM PM       AM PM       Total Daily amount of Acetaminophen Do not take more than  3,000 mg per day        For additional information about how and where to safely dispose of unused opioid medications - [employer]  Disclaimer: This document contains information and/or instructional materials adapted from [facility] for the typical patient with your condition. It does not replace medical advice  from your health care provider because your experience may differ from that of the typical patient. Talk to your health care provider if you have any questions about this document, your condition or your treatment plan. Adapted from [facility] general anes

## 2021-01-01 NOTE — Anesthesia Postprocedure Evaluation (Signed)
Anesthesia Post Note  Patient: Christy Quinn  Procedure(s) Performed: LAPAROSCOPIC CHOLECYSTECTOMY WITH INTRAOPERATIVE CHOLANGIOGRAM     Patient location during evaluation: PACU Anesthesia Type: General Level of consciousness: awake and alert Pain management: pain level controlled Vital Signs Assessment: post-procedure vital signs reviewed and stable Respiratory status: spontaneous breathing, nonlabored ventilation, respiratory function stable and patient connected to nasal cannula oxygen Cardiovascular status: blood pressure returned to baseline and stable Postop Assessment: no apparent nausea or vomiting Anesthetic complications: no   No notable events documented.  Last Vitals:  Vitals:   01/01/21 1718 01/01/21 1733  BP: (!) 107/57 (!) 101/53  Pulse: (!) 54 (!) 50  Resp: 16 13  Temp:  36.6 C  SpO2: 99% 100%    Last Pain:  Vitals:   01/01/21 1733  TempSrc:   PainSc: 0-No pain                 Shelton Silvas

## 2021-01-01 NOTE — Anesthesia Preprocedure Evaluation (Signed)
Anesthesia Evaluation  Patient identified by MRN, date of birth, ID band Patient awake    Reviewed: Allergy & Precautions, NPO status , Patient's Chart, lab work & pertinent test results  History of Anesthesia Complications Negative for: history of anesthetic complications  Airway Mallampati: I  TM Distance: >3 FB Neck ROM: Full    Dental  (+) Dental Advisory Given, Teeth Intact   Pulmonary Current Smoker and Patient abstained from smoking.,    breath sounds clear to auscultation       Cardiovascular negative cardio ROS   Rhythm:Regular     Neuro/Psych negative neurological ROS  negative psych ROS   GI/Hepatic Neg liver ROS, gallstones   Endo/Other  negative endocrine ROS  Renal/GU negative Renal ROS     Musculoskeletal negative musculoskeletal ROS (+)   Abdominal   Peds  Hematology negative hematology ROS (+)   Anesthesia Other Findings   Reproductive/Obstetrics Lab Results      Component                Value               Date                      PREGTESTUR               Negative            11/03/2020                HCG                      <5.0                01/01/2021                                        Anesthesia Physical Anesthesia Plan  ASA: 2  Anesthesia Plan: General   Post-op Pain Management:    Induction: Intravenous  PONV Risk Score and Plan: 3 and Ondansetron, Dexamethasone and Midazolam  Airway Management Planned: Oral ETT  Additional Equipment: None  Intra-op Plan:   Post-operative Plan: Extubation in OR  Informed Consent: I have reviewed the patients History and Physical, chart, labs and discussed the procedure including the risks, benefits and alternatives for the proposed anesthesia with the patient or authorized representative who has indicated his/her understanding and acceptance.     Dental advisory given  Plan Discussed with: CRNA and  Anesthesiologist  Anesthesia Plan Comments:         Anesthesia Quick Evaluation

## 2021-01-01 NOTE — ED Notes (Signed)
Patient transported to Ultrasound 

## 2021-01-01 NOTE — Op Note (Signed)
Laparoscopic Cholecystectomy with IOC Procedure Note  Indications: This patient presents with symptomatic gallbladder disease and will undergo laparoscopic cholecystectomy.  Pre-operative Diagnosis: Calculus of gallbladder with other cholecystitis, without mention of obstruction  Post-operative Diagnosis: Same  Surgeon: Wynona Luna   Assistants: Carl Best, PA-C  Anesthesia: General endotracheal anesthesia  ASA Class: 2  Procedure Details  The patient was seen again in the Holding Room. The risks, benefits, complications, treatment options, and expected outcomes were discussed with the patient. The possibilities of reaction to medication, pulmonary aspiration, perforation of viscus, bleeding, recurrent infection, finding a normal gallbladder, the need for additional procedures, failure to diagnose a condition, the possible need to convert to an open procedure, and creating a complication requiring transfusion or operation were discussed with the patient. The likelihood of improving the patient's symptoms with return to their baseline status is good.  The patient and/or family concurred with the proposed plan, giving informed consent. The site of surgery properly noted. The patient was taken to Operating Room, identified as Levy Pupa and the procedure verified as Laparoscopic Cholecystectomy with Intraoperative Cholangiogram. A Time Out was held and the above information confirmed.  Prior to the induction of general anesthesia, antibiotic prophylaxis was administered. General endotracheal anesthesia was then administered and tolerated well. After the induction, the abdomen was prepped with Chloraprep and draped in the sterile fashion. The patient was positioned in the supine position.  Local anesthetic agent was injected into the skin near the umbilicus and an incision made. We dissected down to the abdominal fascia with blunt dissection.  The fascia was incised vertically and we  entered the peritoneal cavity bluntly.  A pursestring suture of 0-Vicryl was placed around the fascial opening.  The Hasson cannula was inserted and secured with the stay suture.  Pneumoperitoneum was then created with CO2 and tolerated well without any adverse changes in the patient's vital signs. An 11-mm port was placed in the subxiphoid position.  Two 5-mm ports were placed in the right upper quadrant. All skin incisions were infiltrated with a local anesthetic agent before making the incision and placing the trocars.   We positioned the patient in reverse Trendelenburg, tilted slightly to the patient's left.  The gallbladder was identified, the fundus grasped and retracted cephalad. Adhesions were lysed bluntly and with the electrocautery where indicated, taking care not to injure any adjacent organs or viscus. The infundibulum was grasped and retracted laterally, exposing the peritoneum overlying the triangle of Calot. This was then divided and exposed in a blunt fashion. A critical view of the cystic duct and cystic artery was obtained.  The cystic duct was clearly identified and bluntly dissected circumferentially. The cystic duct was ligated with a clip distally.   An incision was made in the cystic duct and the [facility] cholangiogram catheter introduced. The catheter was secured using a clip. A cholangiogram was then obtained which showed good visualization of the distal and proximal biliary tree with no sign of filling defects or obstruction.  Contrast flowed easily into the duodenum. The catheter was then removed.   The cystic duct was then ligated with clips and divided. The cystic artery was identified, dissected free, ligated with clips and divided as well.   The gallbladder was dissected from the liver bed in retrograde fashion with the electrocautery. The gallbladder was removed and placed in an Endocatch sac. The liver bed was irrigated and inspected. Hemostasis was achieved with the  electrocautery. Copious irrigation was utilized and was repeatedly aspirated  until clear.  The gallbladder and Endocatch sac were then removed through the umbilical port site.  The pursestring suture was used to close the umbilical fascia.    We again inspected the right upper quadrant for hemostasis.  Pneumoperitoneum was released as we removed the trocars.  4-0 Monocryl was used to close the skin.   Benzoin, steri-strips, and clean dressings were applied. The patient was then extubated and brought to the recovery room in stable condition. Instrument, sponge, and needle counts were correct at closure and at the conclusion of the case.   Findings: Chronic cholecystitis with Cholelithiasis  Estimated Blood Loss: Minimal         Drains: none         Specimens: Gallbladder           Complications: None; patient tolerated the procedure well.         Disposition: PACU - hemodynamically stable.         Condition: stable  Wilmon Arms. Corliss Skains, MD, [facility]  General Surgery   01/01/2021 4:40 PM

## 2021-01-01 NOTE — Transfer of Care (Addendum)
Immediate Anesthesia Transfer of Care Note  Patient: Christy Quinn  Procedure(s) Performed: LAPAROSCOPIC CHOLECYSTECTOMY WITH INTRAOPERATIVE CHOLANGIOGRAM  Patient Location: PACU  Anesthesia Type:General  Level of Consciousness: awake, alert  and patient cooperative  Airway & Oxygen Therapy: Patient Spontanous Breathing and Patient connected to face mask oxygen  Post-op Assessment: Report given to RN, Post -op Vital signs reviewed and stable and Patient moving all extremities X 4  Post vital signs: Reviewed and stable  Last Vitals:  Vitals Value Taken Time  BP 120/61 01/01/21 1703  Temp    Pulse 76 01/01/21 1705  Resp 14 01/01/21 1704  SpO2 98 % 01/01/21 1705  Vitals shown include unvalidated device data.  Last Pain:  Vitals:   01/01/21 1501  TempSrc:   PainSc: 0-No pain         Complications: No notable events documented.

## 2021-01-01 NOTE — Anesthesia Procedure Notes (Signed)
Procedure Name: Intubation Date/Time: 01/01/2021 3:56 PM Performed by: Justin Mend, RN Pre-anesthesia Checklist: Patient identified, Emergency Drugs available, Suction available and Patient being monitored Patient Re-evaluated:Patient Re-evaluated prior to induction Oxygen Delivery Method: Circle System Utilized Preoxygenation: Pre-oxygenation with 100% oxygen Induction Type: IV induction Ventilation: Mask ventilation without difficulty Laryngoscope Size: Mac and 3 Grade View: Grade I Tube type: Oral Tube size: 7.0 mm Number of attempts: 1 Airway Equipment and Method: Stylet and Oral airway Placement Confirmation: ETT inserted through vocal cords under direct vision, positive ETCO2 and breath sounds checked- equal and bilateral Secured at: 21 cm Tube secured with: Tape Dental Injury: Teeth and Oropharynx as per pre-operative assessment

## 2021-01-01 NOTE — ED Provider Notes (Signed)
[facility] EMERGENCY DEPARTMENT Provider Note   CSN: [id number] Arrival date & time: 01/01/21  0856     History Chief Complaint  Patient presents with   Abdominal Pain    Christy Quinn is a 20 y.o. female presenting to the emergency department with nausea, vomiting, abdominal pain.  Patient reports she is having intermittent symptoms for a year.  She describes sharp stabbing pains in her epigastric and right upper quadrant up into a car after "overeating and eating too much sugar".  She says it also present with burning pain that radiates up towards her throat.  She is currently having mild symptoms.  She was seen in urgent care for a flareup of the symptoms 2 days ago, had blood work which was notable for elevated AST and ALT, advised that she needs a gallbladder ultrasound.  She presents back to the ED for further work-up.  She has not yet seen a specialist and she does not have a PCP.  She denies any other medical problems.  She denies any surgical history.  She denies any drug allergies.  She denies any recent or excessive or daily alcohol use.  HPI     History reviewed. No pertinent past medical history.  There are no problems to display for this patient.   History reviewed. No pertinent surgical history.   OB History   No obstetric history on file.     No family history on file.  Social History   Tobacco Use   Smoking status: Never   Smokeless tobacco: Never  Vaping Use   Vaping Use: Every day   Substances: Nicotine  Substance Use Topics   Alcohol use: Yes    Alcohol/week: 2.0 standard drinks    Types: 2 Shots of liquor per week    Comment: "tequilla, but not often"   Drug use: Yes    Frequency: 7.0 times per week    Types: Marijuana    Home Medications Prior to Admission medications   Medication Sig Start Date End Date Taking? Authorizing Provider  omeprazole (PRILOSEC) 20 MG capsule Take 1 capsule (20 mg total) by mouth 2 (two) times  daily before a meal for 15 days. Patient not taking: Reported on 01/01/2021 11/03/20 11/18/20  Wieters, Elesa Hacker, PA-C    Allergies    Patient has no known allergies.  Review of Systems   Review of Systems  Constitutional:  Negative for chills and fever.  Respiratory:  Negative for cough and shortness of breath.   Cardiovascular:  Negative for chest pain and palpitations.  Gastrointestinal:  Positive for abdominal pain and nausea. Negative for constipation, diarrhea and vomiting.  Musculoskeletal:  Negative for arthralgias and myalgias.  Skin:  Negative for color change and rash.  Neurological:  Negative for syncope and headaches.  All other systems reviewed and are negative.  Physical Exam Updated Vital Signs BP 127/60   Pulse 73   Temp 98.6 F (37 C) (Oral)   Resp 20   Ht 5' 7.5" (1.715 m)   Wt 93.9 kg   SpO2 100%   BMI 31.94 kg/m   Physical Exam Constitutional:      General: She is not in acute distress. HENT:     Head: Normocephalic and atraumatic.  Eyes:     Conjunctiva/sclera: Conjunctivae normal.     Pupils: Pupils are equal, round, and reactive to light.  Cardiovascular:     Rate and Rhythm: Normal rate and regular rhythm.  Pulmonary:  Effort: Pulmonary effort is normal. No respiratory distress.  Abdominal:     General: There is no distension.     Tenderness: There is abdominal tenderness in the right upper quadrant. There is no right CVA tenderness, left CVA tenderness or guarding. Negative signs include Murphy's sign and McBurney's sign.  Skin:    General: Skin is warm and dry.  Neurological:     General: No focal deficit present.     Mental Status: She is alert. Mental status is at baseline.  Psychiatric:        Mood and Affect: Mood normal.        Behavior: Behavior normal.    ED Results / Procedures / Treatments   Labs (all labs ordered are listed, but only abnormal results are displayed) Labs Reviewed  COMPREHENSIVE METABOLIC PANEL -  Abnormal; Notable for the following components:      Result Value   Potassium 3.4 (*)    CO2 21 (*)    AST 164 (*)    ALT 367 (*)    Total Bilirubin 1.3 (*)    All other components within normal limits  URINALYSIS, ROUTINE W REFLEX MICROSCOPIC - Abnormal; Notable for the following components:   APPearance HAZY (*)    Ketones, ur 5 (*)    Leukocytes,Ua SMALL (*)    Bacteria, UA RARE (*)    All other components within normal limits  RESP PANEL BY RT-PCR (FLU A&B, COVID) ARPGX2  LIPASE, BLOOD  CBC  I-STAT BETA HCG BLOOD, ED ([facility], [facility], [facility] ONLY)    EKG None  Radiology US Abdomen Limited RUQ (LIVER/GB)  Result Date: 01/01/2021 CLINICAL DATA:  Right upper quadrant abdominal pain. EXAM: ULTRASOUND ABDOMEN LIMITED RIGHT UPPER QUADRANT COMPARISON:  None. FINDINGS: Gallbladder: Cholelithiasis is noted without gallbladder wall thickening or pericholecystic fluid. No sonographic Murphy's sign is noted. Common bile duct: Diameter: 4 mm which is within normal limits. Liver: No focal lesion identified. Within normal limits in parenchymal echogenicity. Portal vein is patent on color Doppler imaging with normal direction of blood flow towards the liver. Other: None. IMPRESSION: Cholelithiasis without evidence of cholecystitis. Electronically Signed   By: Marijo Conception M.D.   On: 01/01/2021 12:21    Procedures Procedures   Medications Ordered in ED Medications  cefTRIAXone (ROCEPHIN) 2 g in sodium chloride 0.9 % 100 mL IVPB ( Intravenous MAR Hold 01/01/21 1436)  0.9 %  sodium chloride infusion (has no administration in time range)  lactated ringers infusion ( Intravenous New Bag/Given 01/01/21 1502)  chlorhexidine (PERIDEX) 0.12 % solution 15 mL (15 mLs Mouth/Throat Given 01/01/21 1503)    Or  MEDLINE mouth rinse ( Mouth Rinse See Alternative 01/01/21 1503)    ED Course  I have reviewed the triage vital signs and the nursing notes.  Pertinent labs & imaging results that were available  during my care of the patient were reviewed by me and considered in my medical decision making (see chart for details).  There is a diagnosis acute biliary disease or pancreatitis versus gastritis versus other.  I personally viewed the patient's prior medical records including her liver enzymes from 2 days ago, showing elevated AST and ALT.  Comparatively today, I do see some improvement of these numbers, lowering of her AST and ALT, but these are still elevated.  Her T bilirubin is also 1.3.  Her alk phos is within normal limits.  Lipase is also normal.  White blood cell count is normal.  Pregnancy is negative,  and UA does not show sign of infection.  Based on this presentation, I suspect this is consistent with biliary colic and possible choledocholithiasis.  She does not appear to have sepsis at this time.  Low suspicion for abdominal perforation.  She appears quite comfortable.  I will order right upper quadrant ultrasound.  Ultrasound showing gallstones.  General surgery consulted and will admit the patient for possible cholecystectomy.    Clinical Course as of 01/01/21 1503  Thu Jan 01, 2021  1225 Paged general surgery [MT]  1238 General surgery consulted [MT]    Clinical Course User Index [MT] Langston Masker Carola Rhine, MD    Final Clinical Impression(s) / ED Diagnoses Final diagnoses:  Abdominal pain     Langston Masker Carola Rhine, MD 01/01/21 [phone]

## 2021-01-01 NOTE — H&P (Signed)
History and Physical  Christy Quinn 2000/06/06  [id number].    Requesting MD: Dr. Renaye Rakers Chief Complaint/Reason for Consult: symptomatic cholelithiasis  HPI:  20 year old female who presents to the [facility] emergency department with pain, nausea, emesis.  She has had these symptoms intermittently for over a year.  She was seen at an urgent care 2 days ago for the same.  Blood work done at that visit which was notable for elevated AST/ALT and she was recommended to have a gallbladder ultrasound as follow-up.  She does not have a PCP.  She describes pain as sharp and stabbing in the epigastric and right upper quadrant regions.  Pain is worsened after eating.  Currently denies pain but this has been happening more frequently. Most recent episode was with pain yesterday and nausea. Reports some nausea currently but is mild.  Work-up in the emergency department significant for AST/ALT 164/367 compared to 2 days ago at 566/213.  T bili 1.3 which is elevated from 2 days ago at 0.8.  Hypokalemia 3.4.  WBC and lipase normal.  Ultrasound with cholelithiasis without evidence of cholecystitis  Surgery asked to see for further evaluation of her gallbladder.  She denies other significant medical history.  She denies prior surgical history.   Substance use: She denies alcohol use. Smokes tobacco and marijuana Allergies: None Blood thinners: None Past Surgeries: None She works at [employer] on [employer].   ROS: Review of Systems  Constitutional:  Negative for chills and fever.  Respiratory:  Negative for shortness of breath and wheezing.   Cardiovascular:  Negative for chest pain and palpitations.  Gastrointestinal:  Positive for abdominal pain, nausea and vomiting. Negative for blood in stool, constipation, diarrhea and melena.  Genitourinary:  Negative for dysuria, frequency and urgency.  All other systems reviewed and are negative.  No family history on file.  History reviewed. No pertinent  past medical history.  History reviewed. No pertinent surgical history.  Social History:  reports that she has never smoked. She has never used smokeless tobacco. She reports current alcohol use of about 2.0 standard drinks per week. She reports current drug use. Frequency: 7.00 times per week. Drug: Marijuana.  Allergies: No Known Allergies  (Not in a hospital admission)   Blood pressure 123/76, pulse 82, temperature 98.5 F (36.9 C), temperature source Oral, resp. rate 16, SpO2 100 %. Physical Exam:  General: pleasant, WD, female who is laying in bed in NAD HEENT: head is normocephalic, atraumatic.  Sclera are anicteric. Ears and nose without any masses or lesions.  Mouth is pink and moist Heart: regular, rate, and rhythm.  Normal s1,s2. No obvious murmurs, gallops, or rubs noted.  Palpable radial and pedal pulses bilaterally Lungs: CTAB, no wheezes, rhonchi, or rales noted.  Respiratory effort nonlabored Abd: soft, ND, +BS, no masses, hernias, or organomegaly.  Non-tender abdomen MS: all 4 extremities are symmetrical with no cyanosis, clubbing, or edema. Skin: warm and dry with no masses, lesions, or rashes Neuro: Cranial nerves 2-12 grossly intact, sensation is normal throughout Psych: A&Ox3 with an appropriate affect.   Results for orders placed or performed during the hospital encounter of 01/01/21 (from the past 48 hour(s))  Urinalysis, Routine w reflex microscopic Urine, Clean Catch     Status: Abnormal   Collection Time: 01/01/21  9:08 AM  Result Value Ref Range   Color, Urine YELLOW YELLOW   APPearance HAZY (A) CLEAR   Specific Gravity, Urine 1.026 1.005 - 1.030   pH  5.0 5.0 - 8.0   Glucose, UA NEGATIVE NEGATIVE mg/dL   Hgb urine dipstick NEGATIVE NEGATIVE   Bilirubin Urine NEGATIVE NEGATIVE   Ketones, ur 5 (A) NEGATIVE mg/dL   Protein, ur NEGATIVE NEGATIVE mg/dL   Nitrite NEGATIVE NEGATIVE   Leukocytes,Ua SMALL (A) NEGATIVE   RBC / HPF 0-5 0 - 5 RBC/hpf   WBC, UA  6-10 0 - 5 WBC/hpf   Bacteria, UA RARE (A) NONE SEEN   Squamous Epithelial / LPF 6-10 0 - 5   Mucus PRESENT     Comment: Performed at [facility], [address], [place], [place] [address]  Lipase, blood     Status: None   Collection Time: 01/01/21  9:12 AM  Result Value Ref Range   Lipase 30 11 - 51 U/L    Comment: Performed at [facility], [address], [place], [place] [address]  Comprehensive metabolic panel     Status: Abnormal   Collection Time: 01/01/21  9:12 AM  Result Value Ref Range   Sodium 138 135 - 145 mmol/L   Potassium 3.4 (L) 3.5 - 5.1 mmol/L   Chloride 106 98 - 111 mmol/L   CO2 21 (L) 22 - 32 mmol/L   Glucose, Bld 84 70 - 99 mg/dL    Comment: Glucose reference range applies only to samples taken after fasting for at least 8 hours.   BUN 11 6 - 20 mg/dL   Creatinine, Ser 4.94 0.44 - 1.00 mg/dL   Calcium 9.6 8.9 - 49.6 mg/dL   Total Protein 7.3 6.5 - 8.1 g/dL   Albumin 4.2 3.5 - 5.0 g/dL   AST 759 (H) 15 - 41 U/L   ALT 367 (H) 0 - 44 U/L   Alkaline Phosphatase 126 38 - 126 U/L   Total Bilirubin 1.3 (H) 0.3 - 1.2 mg/dL   GFR, Estimated >16 >38 mL/min    Comment: (NOTE) Calculated using the CKD-EPI Creatinine Equation (2021)    Anion gap 11 5 - 15    Comment: Performed at [facility], [address] [place], [place] [address]  CBC     Status: None   Collection Time: 01/01/21  9:12 AM  Result Value Ref Range   WBC 4.6 4.0 - 10.5 K/uL   RBC 4.84 3.87 - 5.11 MIL/uL   Hemoglobin 13.5 12.0 - 15.0 g/dL   HCT 93.5 70.1 - 77.9 %   MCV 86.2 80.0 - 100.0 fL   MCH 27.9 26.0 - 34.0 pg   MCHC 32.4 30.0 - 36.0 g/dL   RDW 39.0 30.0 - 92.3 %   Platelets 261 150 - 400 K/uL   nRBC 0.0 0.0 - 0.2 %    Comment: Performed at [facility], [address] [place], [place] [address]  I-Stat beta hCG blood, ED     Status: None   Collection Time: 01/01/21  9:25 AM  Result Value Ref Range   I-stat hCG, quantitative <5.0 <5 mIU/mL   Comment 3             Comment:   GEST. AGE      CONC.  (mIU/mL)   <=1 WEEK        5 - 50     2 WEEKS       50 - 500     3 WEEKS       100 - 10,000     4 WEEKS  1,000 - 30,000        FEMALE AND NON-PREGNANT FEMALE:     LESS THAN 5 mIU/mL    US Abdomen Limited RUQ (LIVER/GB)  Result Date: 01/01/2021 CLINICAL DATA:  Right upper quadrant abdominal pain. EXAM: ULTRASOUND ABDOMEN LIMITED RIGHT UPPER QUADRANT COMPARISON:  None. FINDINGS: Gallbladder: Cholelithiasis is noted without gallbladder wall thickening or pericholecystic fluid. No sonographic Murphy's sign is noted. Common bile duct: Diameter: 4 mm which is within normal limits. Liver: No focal lesion identified. Within normal limits in parenchymal echogenicity. Portal vein is patent on color Doppler imaging with normal direction of blood flow towards the liver. Other: None. IMPRESSION: Cholelithiasis without evidence of cholecystitis. Electronically Signed   By: Lupita Raider M.D.   On: 01/01/2021 12:21      Assessment/Plan Symptomatic cholelithiasis -Cholelithiasis on ultrasound with elevated LFTs of AST/ALT 164/367, T bili 1.3.  No concerns for acute cholecystitis at this time.  Given she has had ongoing symptoms discussed the option of treatment with laparoscopic cholecystectomy at this time  I have explained the procedure, risks, and aftercare of cholecystectomy.  Risks include but are not limited to bleeding, infection, wound problems, anesthesia, diarrhea, bile leak, injury to common bile duct/liver/intestine.  She seems to understand and agrees to proceed.  Admit to observation and possible laparoscopic cholecystectomy with IOC today.  Possible discharge postop. If patient is unable to be discharge from PACU, we will admit to observation post-operatively  FEN: NPO, IVF ID: [id number] ordered for surgical prophylaxis VTE: None currently  Trixie Deis, [facility] Surgery 01/01/2021, 1:23 PM Please see Amion for pager number during  day hours 7:00am-4:30pm

## 2021-01-01 NOTE — ED Triage Notes (Signed)
Pt here with reports of abnormal liver function labs. Pt reports having abd pain intermittently along with nausea since the beginning of the year.

## 2021-01-02 ENCOUNTER — Encounter (HOSPITAL_COMMUNITY): Payer: Self-pay | Admitting: Surgery

## 2021-01-05 LAB — SURGICAL PATHOLOGY

## 2021-01-07 ENCOUNTER — Encounter (HOSPITAL_BASED_OUTPATIENT_CLINIC_OR_DEPARTMENT_OTHER): Payer: Self-pay | Admitting: Family Medicine

## 2021-01-09 ENCOUNTER — Other Ambulatory Visit: Payer: Self-pay

## 2021-01-09 ENCOUNTER — Emergency Department (HOSPITAL_COMMUNITY): Payer: Medicaid Other

## 2021-01-09 ENCOUNTER — Emergency Department (HOSPITAL_COMMUNITY)
Admission: EM | Admit: 2021-01-09 | Discharge: 2021-01-09 | Disposition: A | Discharge status: Home / Self Care | Payer: Medicaid Other | Attending: Emergency Medicine | Admitting: Emergency Medicine

## 2021-01-09 DIAGNOSIS — N9489 Other specified conditions associated with female genital organs and menstrual cycle: Secondary | ICD-10-CM | POA: Diagnosis not present

## 2021-01-09 DIAGNOSIS — R0602 Shortness of breath: Secondary | ICD-10-CM | POA: Insufficient documentation

## 2021-01-09 DIAGNOSIS — R1010 Upper abdominal pain, unspecified: Secondary | ICD-10-CM | POA: Insufficient documentation

## 2021-01-09 DIAGNOSIS — R079 Chest pain, unspecified: Secondary | ICD-10-CM | POA: Diagnosis not present

## 2021-01-09 LAB — COMPREHENSIVE METABOLIC PANEL
ALT: 29 U/L (ref 0–44)
AST: 15 U/L (ref 15–41)
Albumin: 3.7 g/dL (ref 3.5–5.0)
Alkaline Phosphatase: 70 U/L (ref 38–126)
Anion gap: 6 (ref 5–15)
BUN: 9 mg/dL (ref 6–20)
CO2: 25 mmol/L (ref 22–32)
Calcium: 9.1 mg/dL (ref 8.9–10.3)
Chloride: 106 mmol/L (ref 98–111)
Creatinine, Ser: 0.87 mg/dL (ref 0.44–1.00)
GFR, Estimated: >60 mL/min (ref >60)
Glucose, Bld: 85 mg/dL (ref 70–99)
Potassium: 3.8 mmol/L (ref 3.5–5.1)
Sodium: 137 mmol/L (ref 135–145)
Total Bilirubin: 0.5 mg/dL (ref 0.3–1.2)
Total Protein: 6.5 g/dL (ref 6.5–8.1)

## 2021-01-09 LAB — URINALYSIS, ROUTINE W REFLEX MICROSCOPIC
Bacteria, UA: NONE SEEN
Bilirubin Urine: NEGATIVE
Glucose, UA: NEGATIVE mg/dL
Ketones, ur: NEGATIVE mg/dL
Leukocytes,Ua: NEGATIVE
Nitrite: NEGATIVE
Protein, ur: NEGATIVE mg/dL
Specific Gravity, Urine: 1.014 (ref 1.005–1.030)
pH: 6 (ref 5.0–8.0)

## 2021-01-09 LAB — I-STAT BETA HCG BLOOD, ED (MC, WL, AP ONLY): I-stat hCG, quantitative: <5 m[IU]/mL (ref <5)

## 2021-01-09 LAB — CBC
HCT: 38.1 % (ref 36.0–46.0)
Hemoglobin: 11.9 g/dL — ABNORMAL LOW (ref 12.0–15.0)
MCH: 27.7 pg (ref 26.0–34.0)
MCHC: 31.2 g/dL (ref 30.0–36.0)
MCV: 88.6 fL (ref 80.0–100.0)
Platelets: 271 10*3/uL (ref 150–400)
RBC: 4.3 MIL/uL (ref 3.87–5.11)
RDW: 12.9 % (ref 11.5–15.5)
WBC: 3.7 10*3/uL — ABNORMAL LOW (ref 4.0–10.5)
nRBC: 0 % (ref 0.0–0.2)

## 2021-01-09 LAB — LIPASE, BLOOD: Lipase: 28 U/L (ref 11–51)

## 2021-01-09 MED ORDER — IOHEXOL 350 MG/ML SOLN
100.0000 mL | Freq: Once | INTRAVENOUS | Status: AC | PRN
  Administered 2021-01-09: 100 mL via INTRAVENOUS

## 2021-01-09 NOTE — ED Provider Notes (Signed)
Emergency Medicine Provider Triage Evaluation Note  Christy Quinn , a 20 y.o. female  was evaluated in triage.  Pt complains of left sided chest pain. .  This started two days ago.  She had surgery recently for her gall bladder removal.  She reports this pain is different.  She denies cough or fevers;  No N/V/D. Her pain did radiate into her shoulder blades yesterday.  Dyspnea with exertion.   Review of Systems  Positive: Chest pain, shortness of breath Negative: Fever, cough  Physical Exam  BP 137/79   Pulse 74   Temp 98.5 F (36.9 C) (Oral)   Resp 16   SpO2 100%  Gen:   Awake, no distress   Resp:  Normal effort  MSK:   Moves extremities without difficulty  Other:  Lungs CTAG, RRR. Abdomen is soft, non tender non distended.   Medical Decision Making  Medically screening exam initiated at 12:22 PM.  Appropriate orders placed.  Christy Quinn was informed that the remainder of the evaluation will be completed by another provider, this initial triage assessment does not replace that evaluation, and the importance of remaining in the ED until their evaluation is complete.  Note: Portions of this report may have been transcribed using voice recognition software. Every effort was made to ensure accuracy; however, inadvertent computerized transcription errors may be present    [name] 01/09/21 1228    Margarita Grizzle, MD 01/09/21 [phone]

## 2021-01-09 NOTE — ED Notes (Signed)
ED Provider at bedside. 

## 2021-01-09 NOTE — ED Notes (Signed)
Returned from CT.

## 2021-01-09 NOTE — ED Provider Notes (Signed)
Medical screening examination/treatment/procedure(s) were conducted as a shared visit with non-physician practitioner(s) and myself.  I personally evaluated the patient during the encounter.  Clinical Impression:   Final diagnoses:  Pain of upper abdomen  Chest pain, unspecified type    Patient is a very pleasant 20 year old female who had a cholecystectomy laparoscopically by Dr. Corliss Skains approximately 8 days ago.  She presents to the hospital today with complaint of epigastric and left upper quadrant pain which has been going on for at least 3 days more intensely worse with moving coughing but not worse with eating.  She has not had diarrhea or dysuria.  On my exam she has a soft abdomen which is mildly to moderately tender in the epigastrium and the left upper quadrant but no tenderness in the right upper quadrant or the lower abdomen whatsoever.  She is not peritoneal, she is not tachycardic, lung sounds are clear and she is otherwise well-appearing without tachycardia hypoxia or hypotension.  Proceed with labs and a CT scan to make sure there is no signs of postoperative pulmonary embolism or surgical postoperative infection.  The patient is agreeable to the plan.  Thankfully the work-up is unremarkable and the patient has a nonsurgical abdomen, at this time she is stable for discharge without any signs of postoperative infection or complications   Eber Hong, MD 01/10/21 1455

## 2021-01-09 NOTE — Discharge Instructions (Addendum)
I would recommend you continue taking Tylenol as well as ibuprofen for  your symptoms.  Please follow instructions on the bottles.  Please continue to monitor your symptoms closely.  If you develop any new or worsening symptoms please come back to the emergency department.  It was a pleasure to meet you.

## 2021-01-09 NOTE — ED Provider Notes (Signed)
[facility] EMERGENCY DEPARTMENT Provider Note   CSN: [id number] Arrival date & time: 01/09/21  1137     History Chief Complaint  Patient presents with   Abdominal Pain    Christy Quinn is a 20 y.o. female.  HPI Patient is a 20 year old female who presents to the emergency department due to left low chest pain.  Patient had a laparoscopic cholecystectomy last week and states that she began experiencing her pain intermittently after the procedure.  About 3 days ago her pain became constant.  States it worsens with deep breathing.  Reports associated shortness of breath that worsens with exertion.  Denies any hemoptysis, leg swelling, oral estrogen use, history of blood clots.  Denies any abdominal pain, nausea, vomiting, diarrhea.    No past medical history on file.  There are no problems to display for this patient.   Past Surgical History:  Procedure Laterality Date   CHOLECYSTECTOMY N/A 01/01/2021   Procedure: LAPAROSCOPIC CHOLECYSTECTOMY WITH INTRAOPERATIVE CHOLANGIOGRAM;  Surgeon: Manus Rudd, MD;  Location: [facility] OR;  Service: General;  Laterality: N/A;     OB History   No obstetric history on file.     No family history on file.  Social History   Tobacco Use   Smoking status: Never   Smokeless tobacco: Never  Vaping Use   Vaping Use: Every day   Substances: Nicotine  Substance Use Topics   Alcohol use: Yes    Alcohol/week: 2.0 standard drinks    Types: 2 Shots of liquor per week    Comment: "tequilla, but not often"   Drug use: Yes    Frequency: 7.0 times per week    Types: Marijuana    Home Medications Prior to Admission medications   Medication Sig Start Date End Date Taking? Authorizing Provider  acetaminophen (TYLENOL) 500 MG tablet Take 1,000 mg by mouth every 6 (six) hours as needed for mild pain, fever or headache.   Yes [provider]  ibuprofen (ADVIL) 200 MG tablet Take 800 mg by mouth every 6 (six) hours as  needed for fever, mild pain or headache.   Yes [provider]  HYDROcodone-acetaminophen (NORCO/VICODIN) 5-325 MG tablet Take 1 tablet by mouth every 6 (six) hours as needed for moderate pain. Patient not taking: Reported on 01/09/2021 01/01/21   Juliet Rude, PA-C    Allergies    Patient has no known allergies.  Review of Systems   Review of Systems  All other systems reviewed and are negative. Ten systems reviewed and are negative for acute change, except as noted in the HPI.   Physical Exam Updated Vital Signs BP 111/67   Pulse (!) 59   Temp 98.5 F (36.9 C) (Oral)   Resp 20   LMP 01/07/2021   SpO2 100%   Physical Exam Vitals and nursing note reviewed.  Constitutional:      General: She is not in acute distress.    Appearance: Normal appearance. She is well-developed. She is not ill-appearing, toxic-appearing or diaphoretic.  HENT:     Head: Normocephalic and atraumatic.     Right Ear: External ear normal.     Left Ear: External ear normal.     Nose: Nose normal.     Mouth/Throat:     Mouth: Mucous membranes are moist.     Pharynx: Oropharynx is clear. No oropharyngeal exudate or posterior oropharyngeal erythema.  Eyes:     Extraocular Movements: Extraocular movements intact.  Cardiovascular:  Rate and Rhythm: Normal rate and regular rhythm.     Pulses: Normal pulses.     Heart sounds: Normal heart sounds. No murmur heard.   No friction rub. No gallop.     Comments: Regular rate and rhythm without murmurs, rubs, or gallops. Pulmonary:     Effort: Pulmonary effort is normal. No respiratory distress.     Breath sounds: Normal breath sounds. No stridor. No wheezing, rhonchi or rales.  Abdominal:     General: Abdomen is flat.     Tenderness: There is abdominal tenderness in the epigastric area and left upper quadrant.     Comments: Abdomen is flat and soft. Mild TTP noted to the epigastrium and LUQ. Well-healing port sites from recent surgery.  No  surrounding erythema.  Musculoskeletal:        General: Normal range of motion.     Cervical back: Normal range of motion and neck supple. No tenderness.  Skin:    General: Skin is warm and dry.  Neurological:     General: No focal deficit present.     Mental Status: She is alert and oriented to person, place, and time.  Psychiatric:        Mood and Affect: Mood normal.        Behavior: Behavior normal.   ED Results / Procedures / Treatments   Labs (all labs ordered are listed, but only abnormal results are displayed) Labs Reviewed  CBC - Abnormal; Notable for the following components:      Result Value   WBC 3.7 (*)    Hemoglobin 11.9 (*)    All other components within normal limits  URINALYSIS, ROUTINE W REFLEX MICROSCOPIC - Abnormal; Notable for the following components:   Hgb urine dipstick LARGE (*)    All other components within normal limits  LIPASE, BLOOD  COMPREHENSIVE METABOLIC PANEL  I-STAT BETA HCG BLOOD, ED ([facility], [facility], [facility] ONLY)   EKG None  Radiology DG Chest 2 View  Result Date: 01/09/2021 CLINICAL DATA:  Left-sided chest pain, post-op. Additional history provided: Patient reports lower chest pain and shortness of breath for 2-3 days, gallbladder removed last week. EXAM: CHEST - 2 VIEW COMPARISON:  No pertinent prior exams available for comparison. FINDINGS: Heart size within normal limits. No appreciable airspace consolidation. No evidence of pleural effusion or pneumothorax. No acute bony abnormality identified. Surgical clips within the right upper quadrant of the abdomen. IMPRESSION: No evidence of active cardiopulmonary disease. Electronically Signed   By: Jackey Loge D.O.   On: 01/09/2021 13:11   CT Angio Chest PE W and/or Wo Contrast  Result Date: 01/09/2021 CLINICAL DATA:  Pleuritic chest pain, recent surgery. Gallbladder removal last Thursday, intermittent pain under left ribcage with radiation to bilateral shoulders. EXAM: CT ANGIOGRAPHY CHEST CT ABDOMEN  AND PELVIS WITH CONTRAST TECHNIQUE: Multidetector CT imaging of the chest was performed using the standard protocol during bolus administration of intravenous contrast. Multiplanar CT image reconstructions and MIPs were obtained to evaluate the vascular anatomy. Multidetector CT imaging of the abdomen and pelvis was performed using the standard protocol during bolus administration of intravenous contrast. CONTRAST:  OMNIPAQUE IOHEXOL 350 MG/ML SOLN COMPARISON:  Right upper quadrant ultrasound 01/01/2021, same day chest radiograph FINDINGS: CTA CHEST FINDINGS Cardiovascular: There is adequate opacification of the pulmonary arteries to the segmental level. There is no evidence of pulmonary embolism. The heart is unremarkable. There is no pericardial effusion. The thoracic aorta is unremarkable. Mediastinum/Nodes: The thyroid is unremarkable. The esophagus  is grossly unremarkable. There is no mediastinal, hilar, or axillary lymphadenopathy. Lungs/Pleura: The trachea and central airways are patent. There is no focal consolidation or pulmonary edema. There is no pleural effusion or pneumothorax. Musculoskeletal: There is no acute osseous abnormality or aggressive osseous lesion. Review of the MIP images confirms the above findings. CT ABDOMEN and PELVIS FINDINGS Hepatobiliary: The liver is unremarkable. The patient is status post recent cholecystectomy. There is no abnormal fluid collection in the cholecystectomy bed. There is no biliary ductal dilatation. Pancreas: Unremarkable. Spleen: Unremarkable. Adrenals/Urinary Tract: The adrenals are unremarkable. The kidneys are unremarkable, with no focal lesion, stone, hydronephrosis, or hydroureter. The bladder is unremarkable. Stomach/Bowel: The stomach is unremarkable. There is no evidence of bowel obstruction. There is no abnormal bowel wall thickening or inflammatory change. The appendix is normal. Vascular/Lymphatic: The abdominal aorta is normal in course and  caliber. The major branch vessels are patent. The main portal and splenic veins are patent. There is no abdominal or pelvic lymphadenopathy. Reproductive: The uterus and adnexa are unremarkable. A tampon is noted. Other: There is trace free fluid in the pelvis, nonspecific and within physiologic limits. There is no upper abdominal ascites. There is no free intraperitoneal air. Small locules of gas in the right anterior abdominal wall are likely related to recent surgery. Musculoskeletal: There is no acute osseous abnormality or aggressive osseous lesion. Review of the MIP images confirms the above findings. IMPRESSION: 1. No evidence of pulmonary embolism or other acute pathology in the chest. 2. Status post recent cholecystectomy without evidence of complication. No other acute findings in the abdomen or pelvis. Electronically Signed   By: Lesia Hausen M.D.   On: 01/09/2021 16:41   CT ABDOMEN PELVIS W CONTRAST  Result Date: 01/09/2021 CLINICAL DATA:  Pleuritic chest pain, recent surgery. Gallbladder removal last Thursday, intermittent pain under left ribcage with radiation to bilateral shoulders. EXAM: CT ANGIOGRAPHY CHEST CT ABDOMEN AND PELVIS WITH CONTRAST TECHNIQUE: Multidetector CT imaging of the chest was performed using the standard protocol during bolus administration of intravenous contrast. Multiplanar CT image reconstructions and MIPs were obtained to evaluate the vascular anatomy. Multidetector CT imaging of the abdomen and pelvis was performed using the standard protocol during bolus administration of intravenous contrast. CONTRAST:  OMNIPAQUE IOHEXOL 350 MG/ML SOLN COMPARISON:  Right upper quadrant ultrasound 01/01/2021, same day chest radiograph FINDINGS: CTA CHEST FINDINGS Cardiovascular: There is adequate opacification of the pulmonary arteries to the segmental level. There is no evidence of pulmonary embolism. The heart is unremarkable. There is no pericardial effusion. The thoracic  aorta is unremarkable. Mediastinum/Nodes: The thyroid is unremarkable. The esophagus is grossly unremarkable. There is no mediastinal, hilar, or axillary lymphadenopathy. Lungs/Pleura: The trachea and central airways are patent. There is no focal consolidation or pulmonary edema. There is no pleural effusion or pneumothorax. Musculoskeletal: There is no acute osseous abnormality or aggressive osseous lesion. Review of the MIP images confirms the above findings. CT ABDOMEN and PELVIS FINDINGS Hepatobiliary: The liver is unremarkable. The patient is status post recent cholecystectomy. There is no abnormal fluid collection in the cholecystectomy bed. There is no biliary ductal dilatation. Pancreas: Unremarkable. Spleen: Unremarkable. Adrenals/Urinary Tract: The adrenals are unremarkable. The kidneys are unremarkable, with no focal lesion, stone, hydronephrosis, or hydroureter. The bladder is unremarkable. Stomach/Bowel: The stomach is unremarkable. There is no evidence of bowel obstruction. There is no abnormal bowel wall thickening or inflammatory change. The appendix is normal. Vascular/Lymphatic: The abdominal aorta is normal in course and caliber. The  major branch vessels are patent. The main portal and splenic veins are patent. There is no abdominal or pelvic lymphadenopathy. Reproductive: The uterus and adnexa are unremarkable. A tampon is noted. Other: There is trace free fluid in the pelvis, nonspecific and within physiologic limits. There is no upper abdominal ascites. There is no free intraperitoneal air. Small locules of gas in the right anterior abdominal wall are likely related to recent surgery. Musculoskeletal: There is no acute osseous abnormality or aggressive osseous lesion. Review of the MIP images confirms the above findings. IMPRESSION: 1. No evidence of pulmonary embolism or other acute pathology in the chest. 2. Status post recent cholecystectomy without evidence of complication. No other acute  findings in the abdomen or pelvis. Electronically Signed   By: Lesia Hausen M.D.   On: 01/09/2021 16:41    Procedures Procedures   Medications Ordered in ED Medications  iohexol (OMNIPAQUE) 350 MG/ML injection 100 mL (100 mLs Intravenous Contrast Given 01/09/21 1616)    ED Course  I have reviewed the triage vital signs and the nursing notes.  Pertinent labs & imaging results that were available during my care of the patient were reviewed by me and considered in my medical decision making (see chart for details).    MDM Rules/Calculators/A&P                          Pt is a 20 y.o. female who presents to the emergency department due to chest and abdominal pain status post laparoscopic cholecystectomy last week.  Labs: CBC with a white count of 3.7 and hemoglobin of 11.9. Lipase of 28. CMP within normal limits. I-STAT beta-hCG less than 5. UA with large hemoglobin.  Imaging: CT of the chest shows no evidence of pulmonary embolism or other acute pathology in the chest. CT scan of the abdomen/pelvis with contrast shows status post recent cholecystectomy without evidence of complication.  No other acute findings in the abdomen or pelvis.  I, Placido Sou, PA-C, personally reviewed and evaluated these images and lab results as part of my medical decision-making.  Given patient's recent surgery and complaints today CTA was obtained of the chest which was negative.  CT scan of the abdomen/pelvis was obtained due to her upper abdominal pain which was also negative.  Given her reassuring findings feel that her symptoms today are likely diaphragmatic irritation from her recent surgery.  Recommended she continue to use Tylenol/ibuprofen for management of her symptoms.  Additionally discussed warm/cold compresses.  Feel the patient is stable for discharge at this time and she is agreeable.  We discussed return precautions.  Her questions were answered and she was amicable at the time of  discharge.  Note: Portions of this report may have been transcribed using voice recognition software. Every effort was made to ensure accuracy; however, inadvertent computerized transcription errors may be present.   Final Clinical Impression(s) / ED Diagnoses Final diagnoses:  Pain of upper abdomen  Chest pain, unspecified type   Rx / DC Orders ED Discharge Orders     None        Placido Sou, PA-C 01/09/21 1751    Eber Hong, MD 01/10/21 1455

## 2021-01-09 NOTE — ED Notes (Signed)
Patient transported to CT 

## 2021-01-09 NOTE — ED Triage Notes (Signed)
Pt reports gallbladder removal here last Thursday and x 2 days has had intermittent pain under left rib cage with radiation to bilateral shoulder blades, and pain has been constant since last night. Denies n/v/d.

## 2022-08-20 IMAGING — US US ABDOMEN LIMITED
1 series · 14 of 25 positions shown · non-contrast
Comparison: None.

CLINICAL DATA: Right upper quadrant abdominal pain.

EXAM:
ULTRASOUND ABDOMEN LIMITED RIGHT UPPER QUADRANT

[Series 1: us abdomen limited ruq (liver/gb) · 14 of 70 slices shown]
[im 1/70]
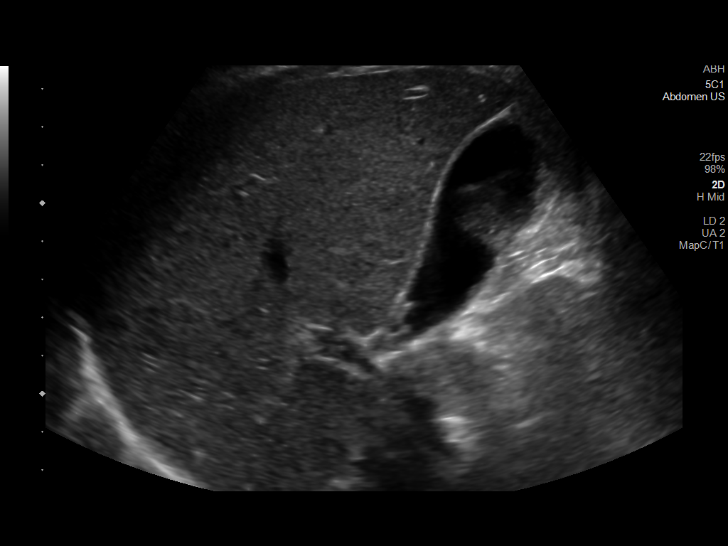
[im 6/70]
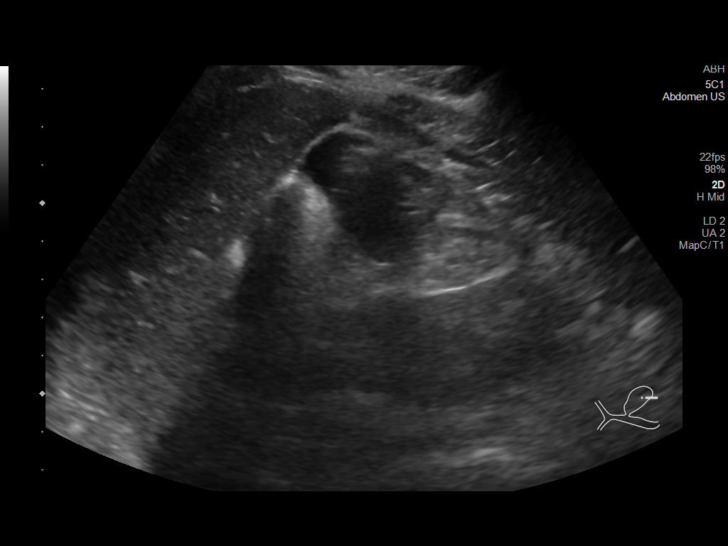
[im 12/70]
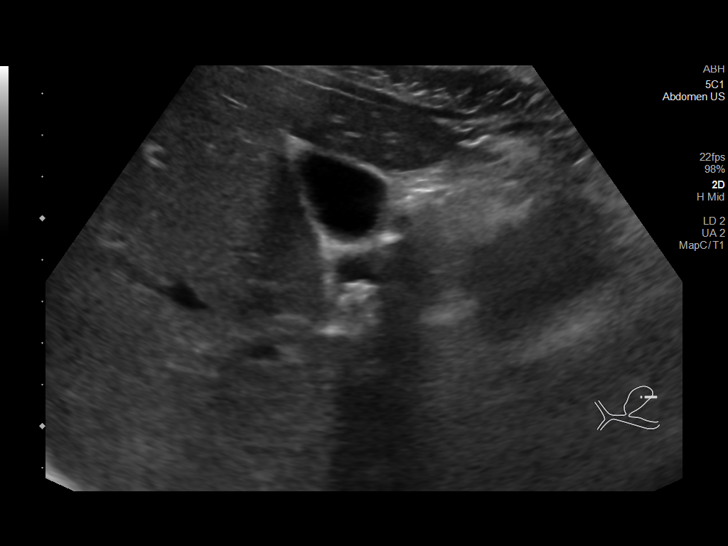
[im 18/70]
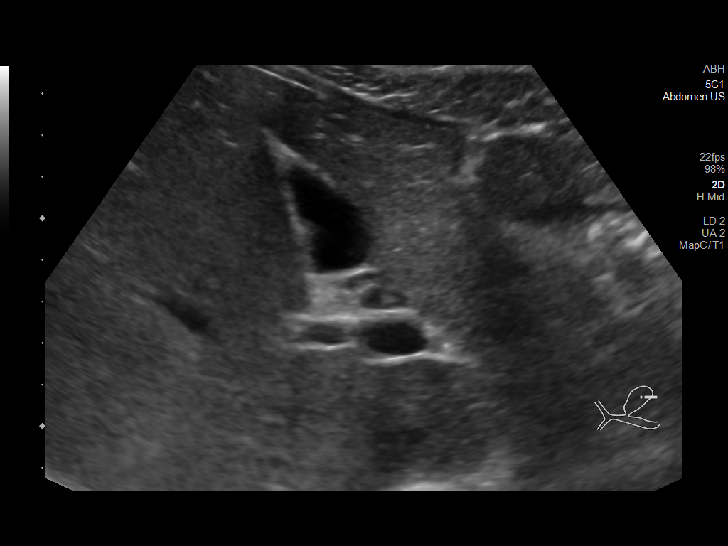
[im 24/70]
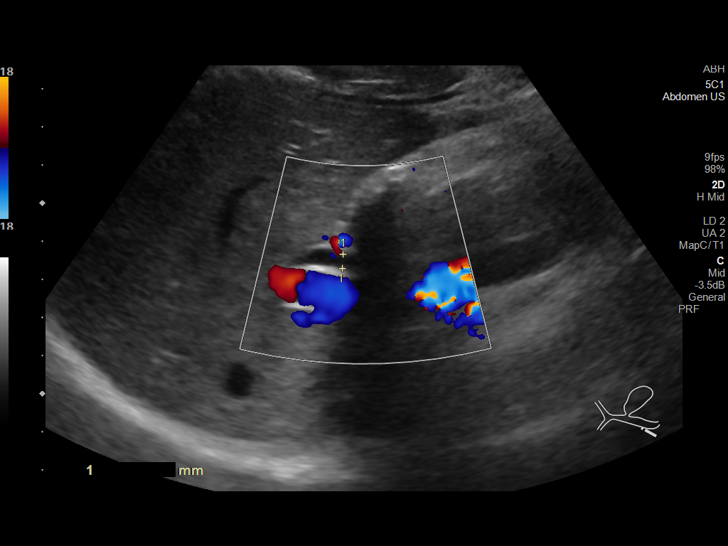
[im 26/70]
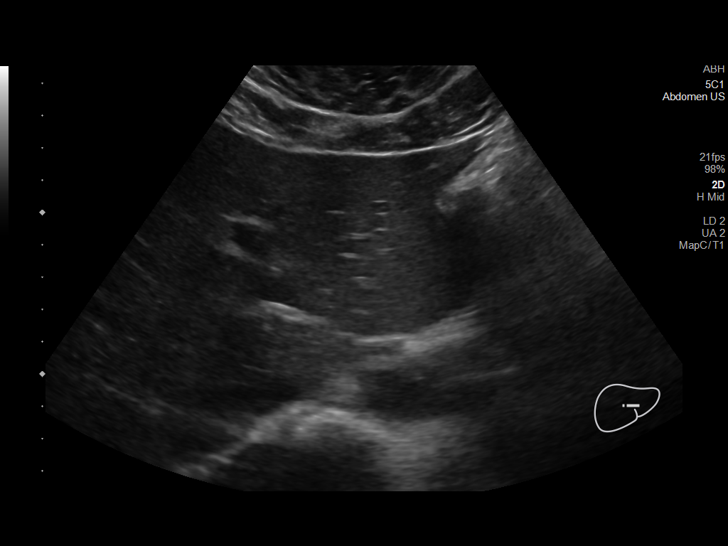
[im 32/70]
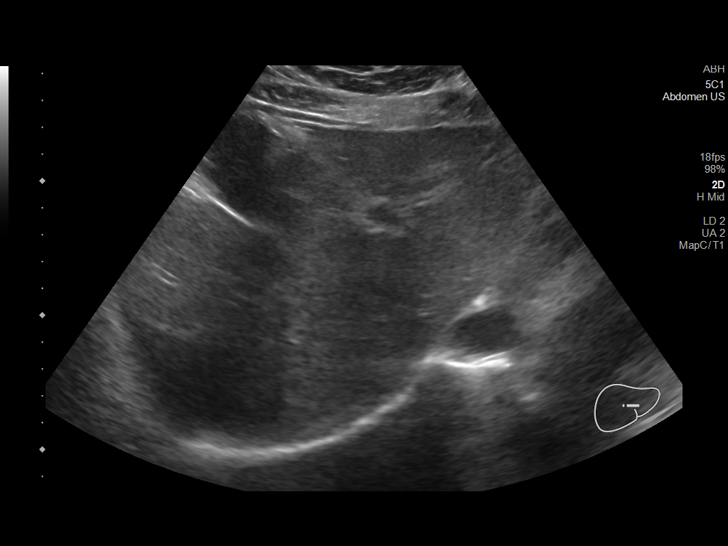
[im 38/70]
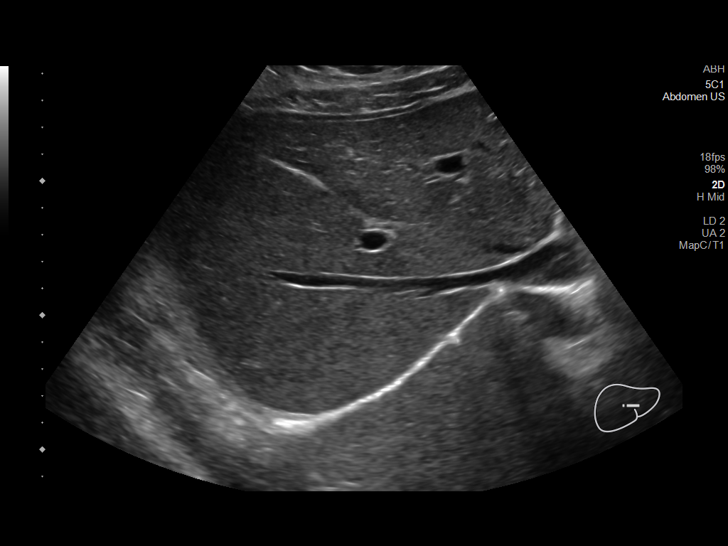
[im 44/70]
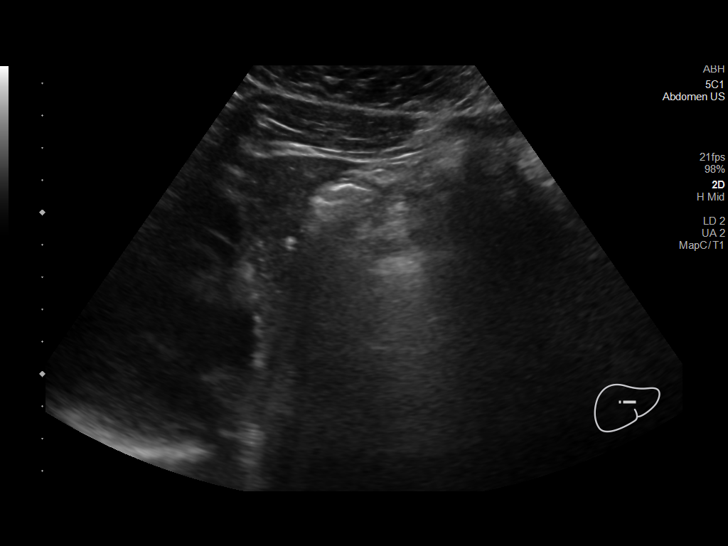
[im 47/70]
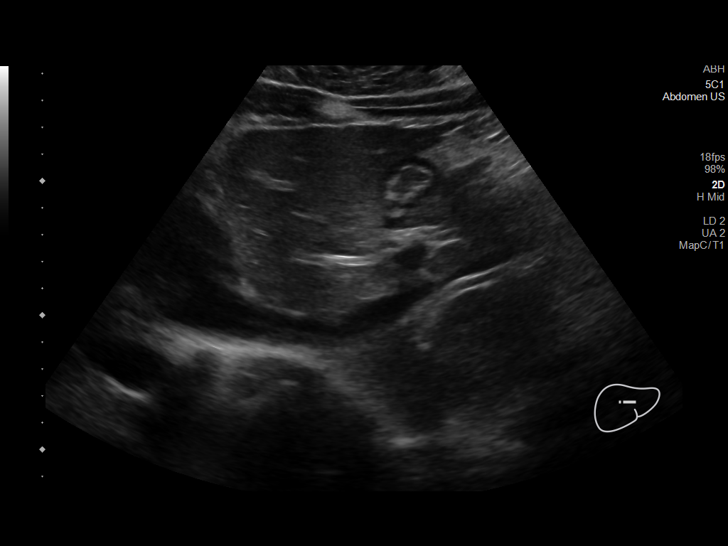
[im 52/70]
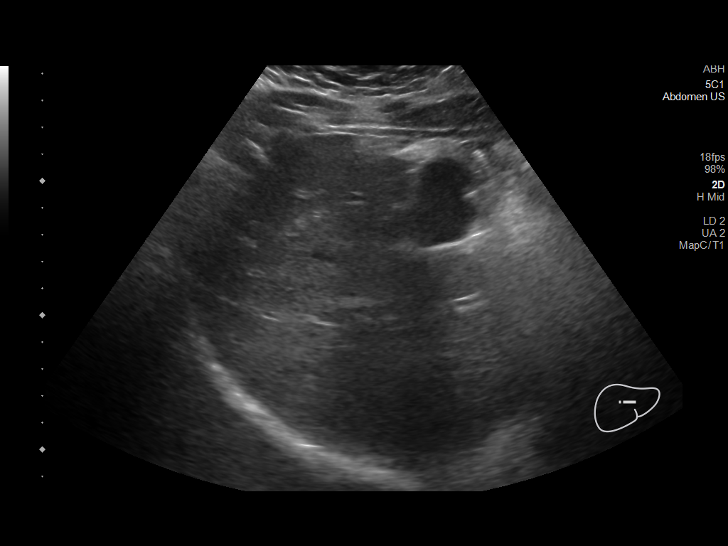
[im 58/70]
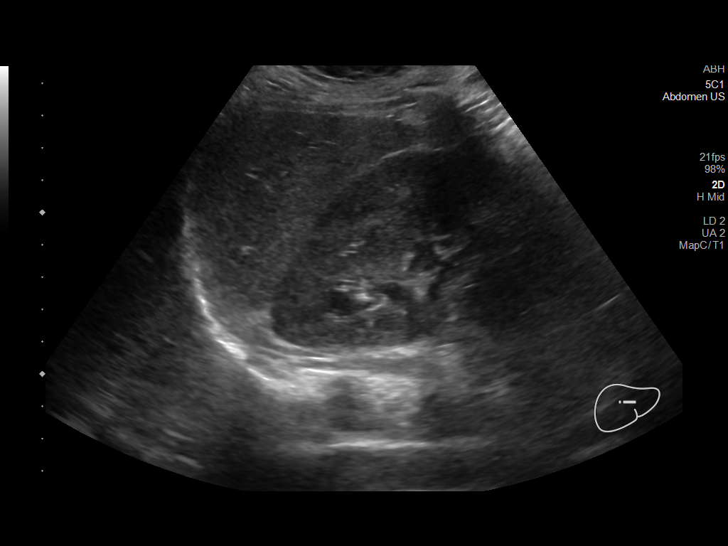
[im 64/70]
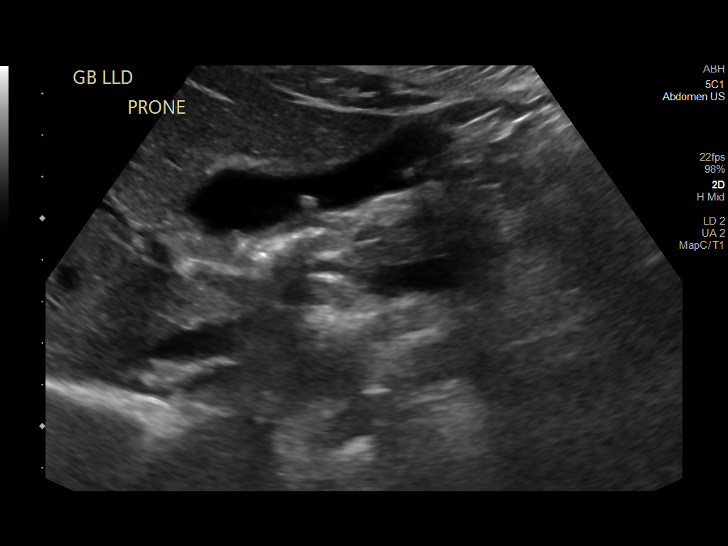
[im 70/70]
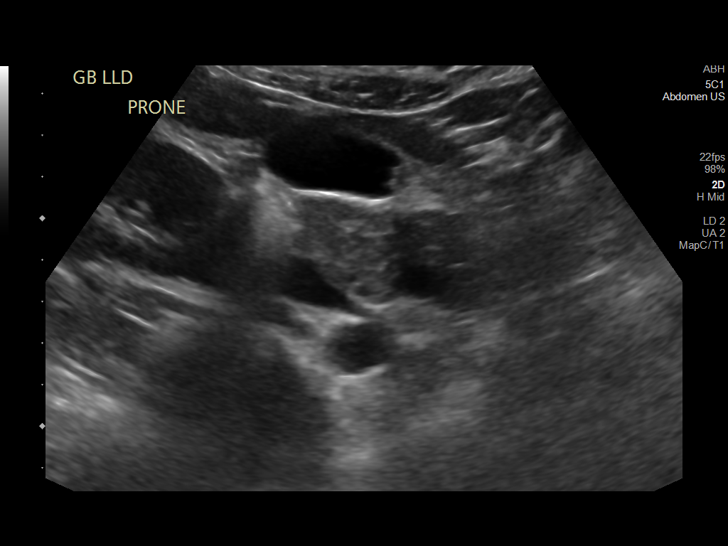

[14 of 25 positions shown; findings below may reference images not displayed]

FINDINGS: Gallbladder:

Cholelithiasis is noted without gallbladder wall thickening or
pericholecystic fluid. No sonographic Murphy's sign is noted.

Common bile duct:

Diameter: 4 mm which is within normal limits.

Liver:

No focal lesion identified. Within normal limits in parenchymal
echogenicity. Portal vein is patent on color Doppler imaging with
normal direction of blood flow towards the liver.

Other: None.
IMPRESSION: Cholelithiasis without evidence of cholecystitis.

## 2022-08-28 IMAGING — CT CT ANGIO CHEST
2 of 6 series · 17 of 46 positions shown · IV contrast (APPLIED)
Comparison: Right upper quadrant ultrasound 01/01/2021, same day
chest radiograph

CLINICAL DATA: Pleuritic chest pain, recent surgery. Gallbladder
removal [REDACTED], intermittent pain under left ribcage with
radiation to bilateral shoulders.

EXAM:
CT ANGIOGRAPHY CHEST
CT ABDOMEN AND PELVIS WITH CONTRAST
TECHNIQUE: Multidetector CT imaging of the chest was performed using the
standard protocol during bolus administration of intravenous
contrast. Multiplanar CT image reconstructions and MIPs were
obtained to evaluate the vascular anatomy. Multidetector CT imaging
of the abdomen and pelvis was performed using the standard protocol
during bolus administration of intravenous contrast.
CONTRAST:  100mL OMNIPAQUE IOHEXOL 350 MG/ML SOLN

[Series 4: thins · axial · 0.83mm/px · z∈[+1160,+1406]mm · 15 of 270 slices shown]
[im 12/270  lung]
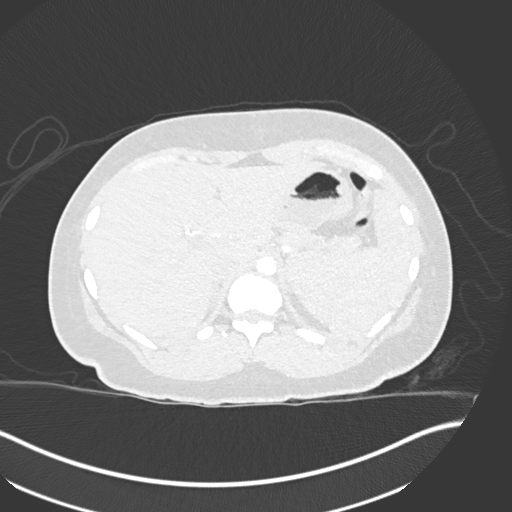
[im 36/270  soft-tissue]
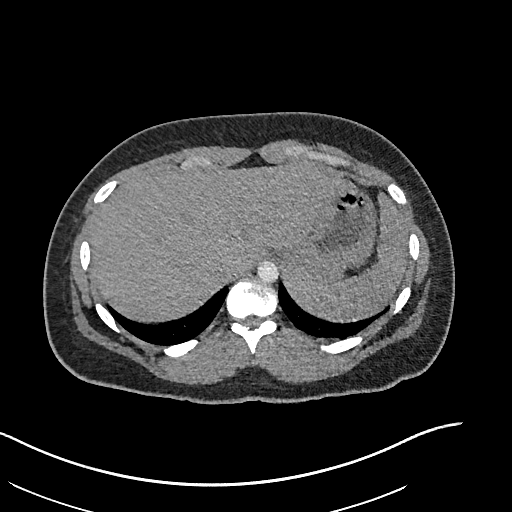
[im 47/270  lung]
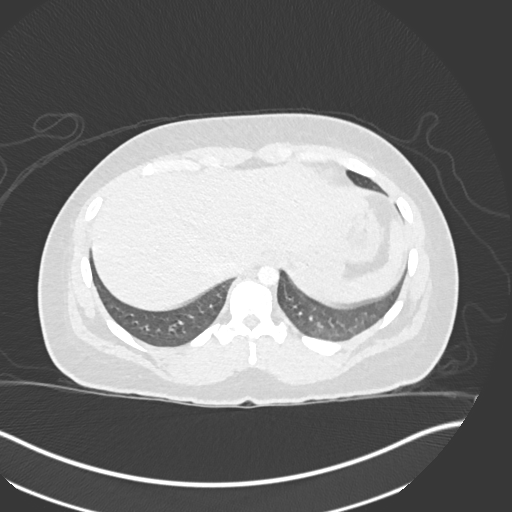
[im 71/270  soft-tissue]
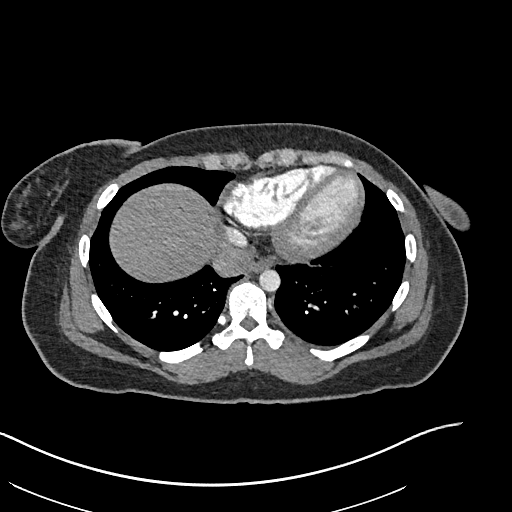
[im 82/270  lung]
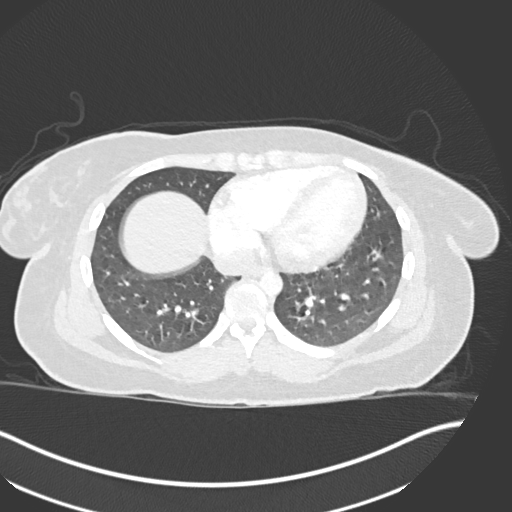
[im 106/270  soft-tissue]
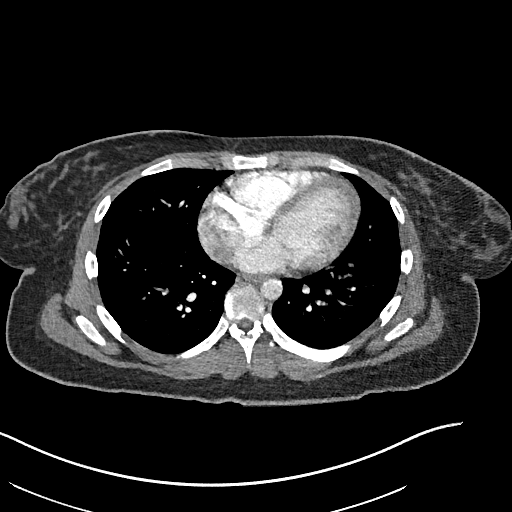
[im 117/270  lung]
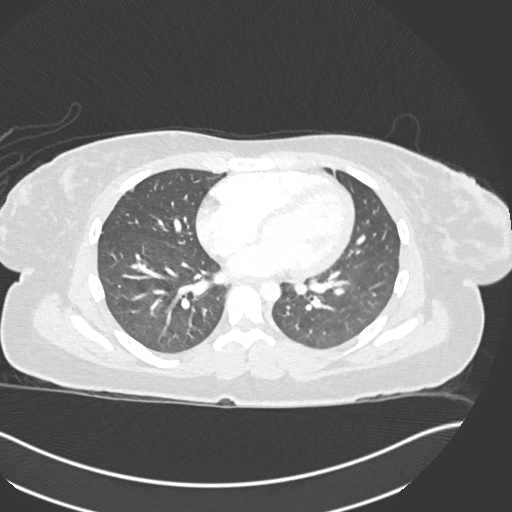
[im 141/270  soft-tissue]
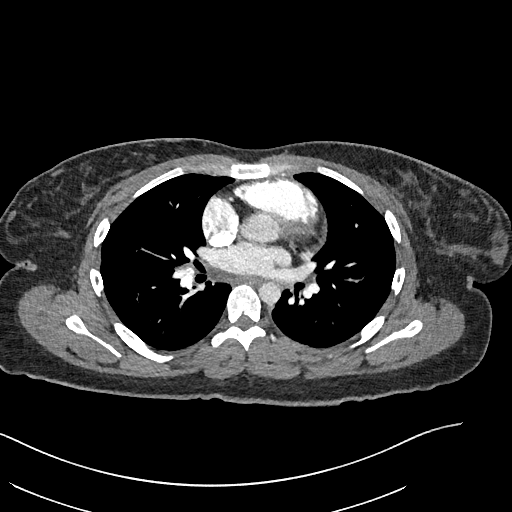
[im 153/270  lung]
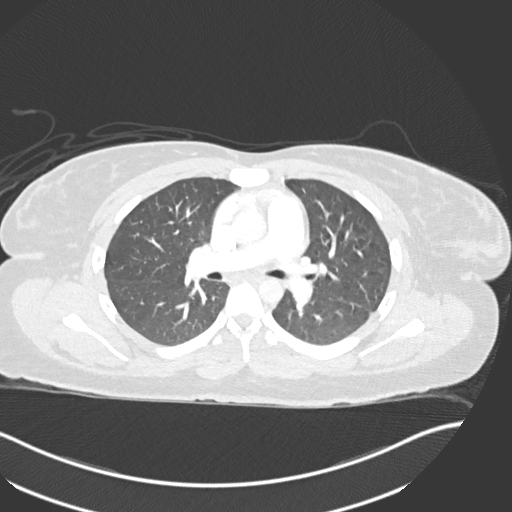
[im 164/270  soft-tissue]
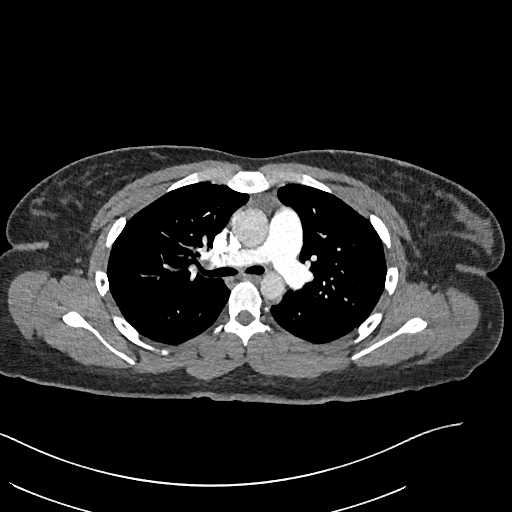
[im 188/270  lung]
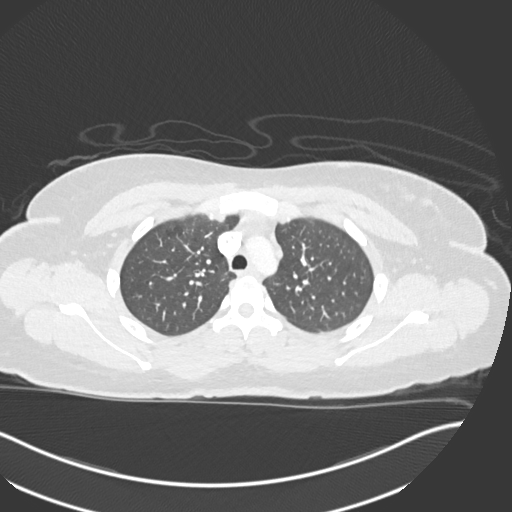
[im 199/270  soft-tissue]
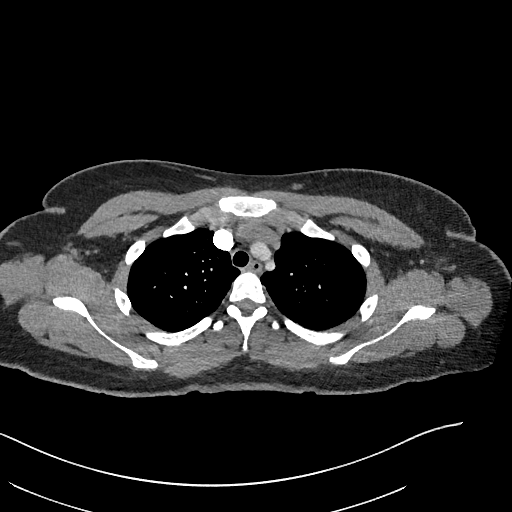
[im 223/270  lung]
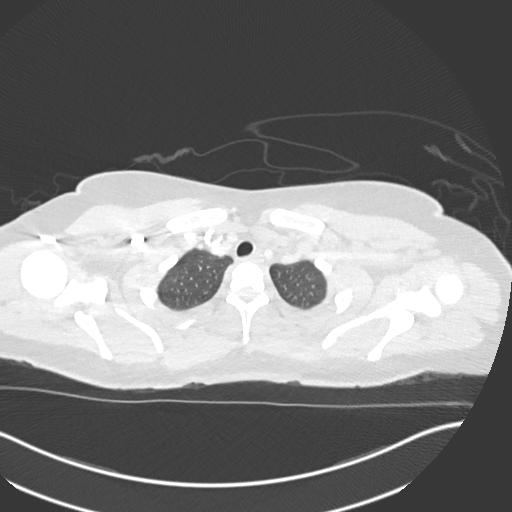
[im 234/270  soft-tissue]
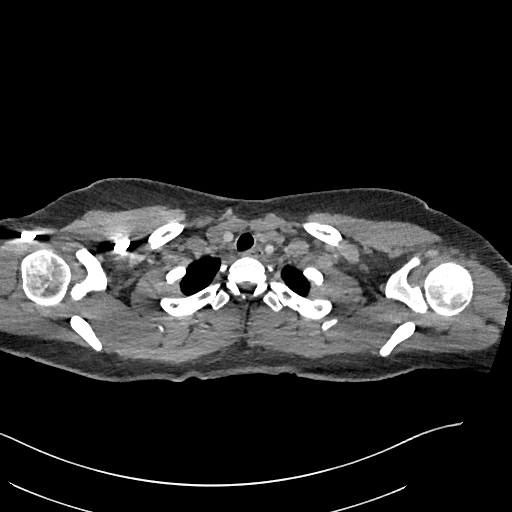
[im 258/270  lung]
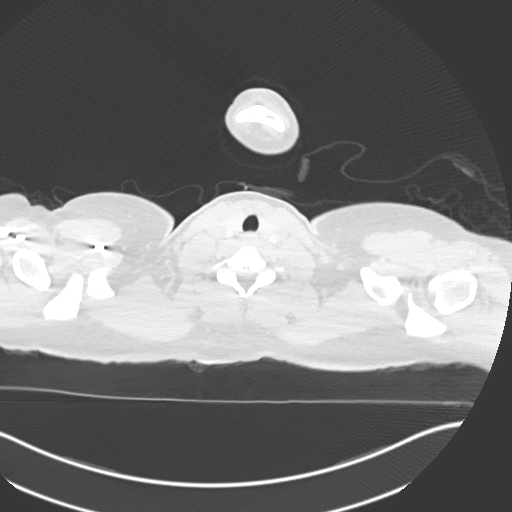

[Series 6: coronal mpr · coronal · 0.53mm/px · 2 of 93 slices shown]
[im 31/93  soft-tissue]
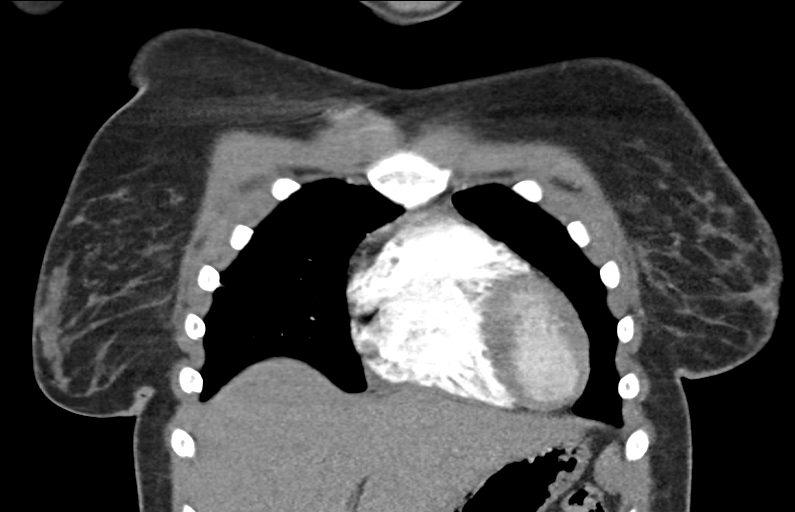
[im 62/93  soft-tissue]
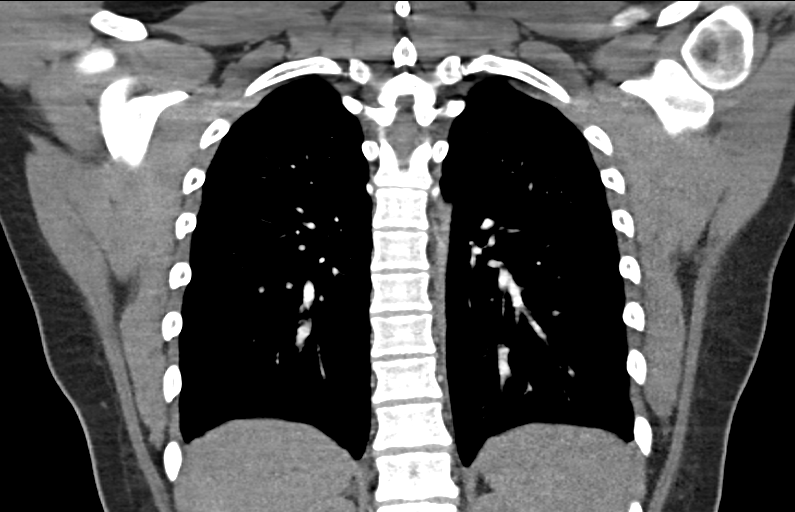

[17 of 46 positions shown; findings below may reference images not displayed]

FINDINGS: CTA CHEST FINDINGS

Cardiovascular: There is adequate opacification of the pulmonary
arteries to the segmental level. There is no evidence of pulmonary
embolism. The heart is unremarkable. There is no pericardial
effusion. The thoracic aorta is unremarkable.

Mediastinum/Nodes: The thyroid is unremarkable. The esophagus is
grossly unremarkable. There is no mediastinal, hilar, or axillary
lymphadenopathy.

Lungs/Pleura: The trachea and central airways are patent.

There is no focal consolidation or pulmonary edema. There is no
pleural effusion or pneumothorax.

Musculoskeletal: There is no acute osseous abnormality or aggressive
osseous lesion.

Review of the MIP images confirms the above findings.

CT ABDOMEN and PELVIS FINDINGS

Hepatobiliary: The liver is unremarkable. The patient is status post
recent cholecystectomy. There is no abnormal fluid collection in the
cholecystectomy bed. There is no biliary ductal dilatation.

Pancreas: Unremarkable.

Spleen: Unremarkable.

Adrenals/Urinary Tract:

The adrenals are unremarkable.

The kidneys are unremarkable, with no focal lesion, stone,
hydronephrosis, or hydroureter. The bladder is unremarkable.

Stomach/Bowel: The stomach is unremarkable. There is no evidence of
bowel obstruction. There is no abnormal bowel wall thickening or
inflammatory change. The appendix is normal.

Vascular/Lymphatic: The abdominal aorta is normal in course and
caliber. The major branch vessels are patent. The main portal and
splenic veins are patent. There is no abdominal or pelvic
lymphadenopathy.

Reproductive: The uterus and adnexa are unremarkable. A tampon is
noted.

Other: There is trace free fluid in the pelvis, nonspecific and
within physiologic limits. There is no upper abdominal ascites.
There is no free intraperitoneal air. Small locules of gas in the
right anterior abdominal wall are likely related to recent surgery.

Musculoskeletal: There is no acute osseous abnormality or aggressive
osseous lesion.

Review of the MIP images confirms the above findings.
IMPRESSION: 1. No evidence of pulmonary embolism or other acute pathology in the
chest.
2. Status post recent cholecystectomy without evidence of
complication. No other acute findings in the abdomen or pelvis.

## 2022-09-26 ENCOUNTER — Emergency Department (HOSPITAL_COMMUNITY)
Admission: EM | Admit: 2022-09-26 | Discharge: 2022-09-26 | Disposition: A | Discharge status: Home / Self Care | Payer: Self-pay | Attending: Emergency Medicine | Admitting: Emergency Medicine

## 2022-09-26 ENCOUNTER — Ambulatory Visit (HOSPITAL_COMMUNITY)
Admission: RE | Admit: 2022-09-26 | Discharge: 2022-09-26 | Disposition: A | Discharge status: Home / Self Care | Payer: Medicaid Other | Source: Ambulatory Visit | Attending: Emergency Medicine | Admitting: Emergency Medicine

## 2022-09-26 ENCOUNTER — Other Ambulatory Visit: Payer: Self-pay

## 2022-09-26 DIAGNOSIS — W1849XA Other slipping, tripping and stumbling without falling, initial encounter: Secondary | ICD-10-CM | POA: Insufficient documentation

## 2022-09-26 DIAGNOSIS — M7989 Other specified soft tissue disorders: Secondary | ICD-10-CM

## 2022-09-26 DIAGNOSIS — Z23 Encounter for immunization: Secondary | ICD-10-CM | POA: Insufficient documentation

## 2022-09-26 DIAGNOSIS — S81811A Laceration without foreign body, right lower leg, initial encounter: Secondary | ICD-10-CM | POA: Insufficient documentation

## 2022-09-26 DIAGNOSIS — M25471 Effusion, right ankle: Secondary | ICD-10-CM | POA: Insufficient documentation

## 2022-09-26 MED ORDER — TETANUS-DIPHTH-ACELL PERTUSSIS 5-2.5-18.5 LF-MCG/0.5 IM SUSY
0.5000 mL | PREFILLED_SYRINGE | Freq: Once | INTRAMUSCULAR | Status: AC
  Administered 2022-09-26: 0.5 mL via INTRAMUSCULAR
  Filled 2022-09-26: qty 0.5

## 2022-09-26 NOTE — Progress Notes (Signed)
Right lower extremity venous study completed.   Please see CV Procedures for preliminary results.  Bane Hagy, RVT  11:43 AM 09/26/22

## 2022-09-26 NOTE — Discharge Instructions (Addendum)
I have put in an order for them to ultrasound her leg to evaluate for a blood clot.  Please try and elevate this leg up above the level of your heart whenever you are not up and moving around on it.  Please follow-up with your family doctor in the office.

## 2022-09-26 NOTE — ED Triage Notes (Signed)
Patient reports skin abrasion at right shin with mild numbness around the area sustained last week when she slipped while using the stairs . No bleeding .

## 2022-09-26 NOTE — ED Provider Notes (Signed)
[place] EMERGENCY DEPARTMENT AT [facility] Provider Note   CSN: [id number] Arrival date & time: 09/26/22  0030     History  Chief Complaint  Patient presents with   Abrasion    Right Deanza Gentilini is a 22 y.o. female.  22 yo F with a chief complaints of right lower extremity edema.  The patient about a week ago ended up slipping and hitting the edge of the pool with her right shin.  Suffered a wound to the skin.  Feels like it is little bit numb just distal to the injury.  Yesterday she noticed that her right leg was a bit more swollen than the left and came here to be evaluated.  She denies any obvious injury to the ankle.  Has been able to ambulate without issue.        Home Medications Prior to Admission medications   Medication Sig Start Date End Date Taking? Authorizing Provider  acetaminophen (TYLENOL) 500 MG tablet Take 1,000 mg by mouth every 6 (six) hours as needed for mild pain, fever or headache.    [provider]  HYDROcodone-acetaminophen (NORCO/VICODIN) 5-325 MG tablet Take 1 tablet by mouth every 6 (six) hours as needed for moderate pain. Patient not taking: Reported on 01/09/2021 01/01/21   Juliet Rude, PA-C  ibuprofen (ADVIL) 200 MG tablet Take 800 mg by mouth every 6 (six) hours as needed for fever, mild pain or headache.    [provider]      Allergies    Patient has no known allergies.    Review of Systems   Review of Systems  Physical Exam Updated Vital Signs BP 131/72   Pulse 73   Temp 98.4 F (36.9 C)   Resp 16   SpO2 100%  Physical Exam Vitals and nursing note reviewed.  Constitutional:      General: She is not in acute distress.    Appearance: She is well-developed. She is not diaphoretic.  HENT:     Head: Normocephalic and atraumatic.  Eyes:     Pupils: Pupils are equal, round, and reactive to light.  Cardiovascular:     Rate and Rhythm: Normal rate and regular rhythm.     Heart  sounds: No murmur heard.    No friction rub. No gallop.  Pulmonary:     Effort: Pulmonary effort is normal.     Breath sounds: No wheezing or rales.  Abdominal:     General: There is no distension.     Palpations: Abdomen is soft.     Tenderness: There is no abdominal tenderness.  Musculoskeletal:        General: Swelling present. No tenderness.     Cervical back: Normal range of motion and neck supple.     Right lower leg: Edema present.     Comments: Patient is right lower extremity edema compared to the left.  Edema mostly about the ankle but has no obvious tenderness about the medial or lateral malleolus.  Intact pulse motor and sensation.  She has a healing laceration along the anterior shin.  She has some subjective decrease sensation to light touch just distal to this.  She has no obvious bony tenderness.  No crepitus.  No erythema or induration no drainage.  Skin:    General: Skin is warm and dry.  Neurological:     Mental Status: She is alert and oriented to person, place, and time.  Psychiatric:  Behavior: Behavior normal.     ED Results / Procedures / Treatments   Labs (all labs ordered are listed, but only abnormal results are displayed) Labs Reviewed - No data to display  EKG None  Radiology No results found.  Procedures Procedures    Medications Ordered in ED Medications  Tdap (BOOSTRIX) injection 0.5 mL (has no administration in time range)    ED Course/ Medical Decision Making/ A&P                             Medical Decision Making Risk Prescription drug management.   22 yo F with a chief complaints of a right leg injury.  The patient suffered what looks like a laceration to the right anterior shin.  This was about a week ago.  She developed some edema to the leg yesterday.  She does have unilateral edema.  No obvious cellulitis.  Seems unlikely to have a deep space infection.  Well-appearing nontoxic.  No significant tenderness.  I think DVT  is unlikely but in the differential.  Unfortunately vascular ultrasound is not available at this time.  Will place an order for her to have it done tomorrow.  Updated tetanus.  PCP follow-up.  1:33 AM:  I have discussed the diagnosis/risks/treatment options with the patient and family.  Evaluation and diagnostic testing in the emergency department does not suggest an emergent condition requiring admission or immediate intervention beyond what has been performed at this time.  They will follow up with PCP. We also discussed returning to the ED immediately if new or worsening sx occur. We discussed the sx which are most concerning (e.g., sudden worsening pain, fever, inability to tolerate by mouth) that necessitate immediate return. Medications administered to the patient during their visit and any new prescriptions provided to the patient are listed below.  Medications given during this visit Medications  Tdap (BOOSTRIX) injection 0.5 mL (has no administration in time range)     The patient appears reasonably screen and/or stabilized for discharge and I doubt any other medical condition or other [facility] requiring further screening, evaluation, or treatment in the ED at this time prior to discharge.          Final Clinical Impression(s) / ED Diagnoses Final diagnoses:  Right leg swelling  Leg laceration, right, initial encounter    Rx / DC Orders ED Discharge Orders          Ordered    LE [facility]ENOUS        09/26/22 0130              Melene Plan, DO 09/26/22 0133
